# Patient Record
Sex: Male | Born: 2000 | Race: White | Hispanic: No | Marital: Single | Smoking: Never smoker
Health system: Southern US, Community
[De-identification: ages and names within clinical notes are randomized; demographics above are authoritative.]

## PROBLEM LIST (undated history)

## (undated) DIAGNOSIS — R197 Diarrhea, unspecified: Secondary | ICD-10-CM

## (undated) DIAGNOSIS — R569 Unspecified convulsions: Secondary | ICD-10-CM

## (undated) DIAGNOSIS — K219 Gastro-esophageal reflux disease without esophagitis: Secondary | ICD-10-CM

## (undated) DIAGNOSIS — T744XXA Shaken infant syndrome, initial encounter: Secondary | ICD-10-CM

## (undated) DIAGNOSIS — G809 Cerebral palsy, unspecified: Secondary | ICD-10-CM

## (undated) DIAGNOSIS — H905 Unspecified sensorineural hearing loss: Secondary | ICD-10-CM

## (undated) DIAGNOSIS — F419 Anxiety disorder, unspecified: Secondary | ICD-10-CM

## (undated) DIAGNOSIS — H547 Unspecified visual loss: Secondary | ICD-10-CM

## (undated) HISTORY — PX: GASTROSTOMY-JEJEUNOSTOMY TUBE CHANGE/PLACEMENT: SHX1705

## (undated) HISTORY — PX: LEG SURGERY: SHX1003

## (undated) HISTORY — DX: Shaken infant syndrome, initial encounter: T74.4XXA

## (undated) HISTORY — PX: TYMPANOSTOMY TUBE PLACEMENT: SHX32

## (undated) HISTORY — PX: VENTRICULOPERITONEAL SHUNT: SHX204

## (undated) HISTORY — DX: Diarrhea, unspecified: R19.7

## (undated) HISTORY — DX: Gastro-esophageal reflux disease without esophagitis: K21.9

## (undated) HISTORY — PX: CRANIOTOMY: SHX93

---
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
## 2022-01-18 ENCOUNTER — Emergency Department (HOSPITAL_COMMUNITY): Payer: Medicaid Other

## 2022-01-18 ENCOUNTER — Emergency Department (HOSPITAL_COMMUNITY)
Admission: EM | Admit: 2022-01-18 | Discharge: 2022-01-18 | Disposition: A | Discharge status: Home / Self Care | Payer: Medicaid Other | Attending: Emergency Medicine | Admitting: Emergency Medicine

## 2022-01-18 ENCOUNTER — Encounter (HOSPITAL_COMMUNITY): Payer: Self-pay

## 2022-01-18 ENCOUNTER — Other Ambulatory Visit: Payer: Self-pay

## 2022-01-18 DIAGNOSIS — R001 Bradycardia, unspecified: Secondary | ICD-10-CM | POA: Diagnosis not present

## 2022-01-18 DIAGNOSIS — R112 Nausea with vomiting, unspecified: Secondary | ICD-10-CM | POA: Insufficient documentation

## 2022-01-18 DIAGNOSIS — R109 Unspecified abdominal pain: Secondary | ICD-10-CM | POA: Insufficient documentation

## 2022-01-18 LAB — COMPREHENSIVE METABOLIC PANEL
ALT: 25 U/L (ref 0–44)
AST: 31 U/L (ref 15–41)
Albumin: 4.6 g/dL (ref 3.5–5.0)
Alkaline Phosphatase: 77 U/L (ref 38–126)
Anion gap: 14 (ref 5–15)
BUN: 10 mg/dL (ref 6–20)
CO2: 22 mmol/L (ref 22–32)
Calcium: 9.9 mg/dL (ref 8.9–10.3)
Chloride: 106 mmol/L (ref 98–111)
Creatinine, Ser: 0.63 mg/dL (ref 0.61–1.24)
GFR, Estimated: >60 mL/min (ref >60)
Glucose, Bld: 146 mg/dL — ABNORMAL HIGH (ref 70–99)
Potassium: 4.5 mmol/L (ref 3.5–5.1)
Sodium: 142 mmol/L (ref 135–145)
Total Bilirubin: 0.8 mg/dL (ref 0.3–1.2)
Total Protein: 7.4 g/dL (ref 6.5–8.1)

## 2022-01-18 LAB — URINALYSIS, ROUTINE W REFLEX MICROSCOPIC
Bilirubin Urine: NEGATIVE
Glucose, UA: NEGATIVE mg/dL
Hgb urine dipstick: NEGATIVE
Ketones, ur: NEGATIVE mg/dL
Leukocytes,Ua: NEGATIVE
Nitrite: NEGATIVE
Protein, ur: 30 mg/dL — AB
Specific Gravity, Urine: 1.015 (ref 1.005–1.030)
pH: 7 (ref 5.0–8.0)

## 2022-01-18 LAB — CBC WITH DIFFERENTIAL/PLATELET
Abs Immature Granulocytes: 0.07 10*3/uL (ref 0.00–0.07)
Basophils Absolute: 0 10*3/uL (ref 0.0–0.1)
Basophils Relative: 0 %
Eosinophils Absolute: 0 10*3/uL (ref 0.0–0.5)
Eosinophils Relative: 0 %
HCT: 54.5 % — ABNORMAL HIGH (ref 39.0–52.0)
Hemoglobin: 17.3 g/dL — ABNORMAL HIGH (ref 13.0–17.0)
Immature Granulocytes: 1 %
Lymphocytes Relative: 4 %
Lymphs Abs: 0.6 10*3/uL — ABNORMAL LOW (ref 0.7–4.0)
MCH: 31 pg (ref 26.0–34.0)
MCHC: 31.7 g/dL (ref 30.0–36.0)
MCV: 97.7 fL (ref 80.0–100.0)
Monocytes Absolute: 0.8 10*3/uL (ref 0.1–1.0)
Monocytes Relative: 5 %
Neutro Abs: 12.7 10*3/uL — ABNORMAL HIGH (ref 1.7–7.7)
Neutrophils Relative %: 90 %
Platelets: 220 10*3/uL (ref 150–400)
RBC: 5.58 MIL/uL (ref 4.22–5.81)
RDW: 12.7 % (ref 11.5–15.5)
WBC: 14.2 10*3/uL — ABNORMAL HIGH (ref 4.0–10.5)
nRBC: 0 % (ref 0.0–0.2)

## 2022-01-18 LAB — URINALYSIS, MICROSCOPIC (REFLEX)

## 2022-01-18 LAB — LACTIC ACID, PLASMA: Lactic Acid, Venous: 3.7 mmol/L (ref 0.5–1.9)

## 2022-01-18 LAB — LIPASE, BLOOD: Lipase: 46 U/L (ref 11–51)

## 2022-01-18 LAB — CORTISOL: Cortisol, Plasma: 41.5 ug/dL

## 2022-01-18 MED ORDER — PEDIALYTE PO SOLN
500.0000 mL | ORAL | Status: DC
  Filled 2022-01-18: qty 1000

## 2022-01-18 MED ORDER — ONDANSETRON 4 MG PO TBDP
4.0000 mg | ORAL_TABLET | Freq: Once | ORAL | Status: AC
  Administered 2022-01-18: 4 mg via ORAL
  Filled 2022-01-18: qty 1

## 2022-01-18 MED ORDER — ACETAMINOPHEN 160 MG/5ML PO SOLN
650.0000 mg | Freq: Once | ORAL | Status: AC
  Administered 2022-01-18: 650 mg
  Filled 2022-01-18: qty 20.3

## 2022-01-18 MED ORDER — CALCIUM CARBONATE ANTACID 500 MG PO CHEW
1.0000 | CHEWABLE_TABLET | Freq: Once | ORAL | Status: AC
  Administered 2022-01-18: 200 mg via JEJUNOSTOMY
  Filled 2022-01-18: qty 1

## 2022-01-18 NOTE — ED Notes (Signed)
EDP notified of elevated lactic 

## 2022-01-18 NOTE — ED Triage Notes (Signed)
Patient has CP and has a lot of stomach issues and [facility] children wanted him seen if he had another flare up of abd pain and vomiting This episode started last night.

## 2022-01-18 NOTE — Discharge Instructions (Signed)
Your son was seen in the emergency department for his nausea and vomiting.  This seems to be consistent with his previous episodes of cyclical vomiting thought to be from his gastroparesis.  He had no further vomiting while in the emergency department.  He did have some signs of dehydration on his labs and you should hydrate him with Pedialyte and give him Zofran as needed.  You should call the motility team to discuss his symptoms today and to ensure that he has appropriate follow-up.  He should return to the emergency department if he is not tolerating his feeds, he has fevers, he is worsening confusion or if you have any other new or concerning symptoms.

## 2022-01-18 NOTE — ED Notes (Signed)
DC instructions reviewed with pt's family. Understanding was verbalized. PT DC.

## 2022-01-18 NOTE — ED Provider Notes (Signed)
[facility] EMERGENCY DEPARTMENT Provider Note   CSN: [id number] Arrival date & time: 01/18/22  0725     History  Chief Complaint  Patient presents with   Abdominal Pain    Jonathan Booker is a 21 y.o. male.  Patient is an 21 year old male with a past medical history of cerebral palsy, seizure disorder, GERD and gastroparesis with J-tube in place presenting to the emergency department with nausea and vomiting.  Patient is here with his mother who states that she fed him dinner normally last night and then found him vomiting in his room overnight.  She states that he regurgitated all undigested food.  She states that he is also having abdominal pain.  She states that he has not had any fevers or diarrhea or constipation.  She states that he has been urinating normally.  She states that he is being seen by a GI specialist at [facility] and was recommended to come to the emergency department for evaluation for abdominal migraines versus gastroparesis as a cause of his vomiting.   Abdominal Pain      Home Medications Prior to Admission medications   Medication Sig Start Date End Date Taking? Authorizing Provider  acetaminophen (TYLENOL) 160 MG/5ML solution Take 320 mg by mouth every 6 (six) hours as needed for mild pain, fever or headache.    [provider]  Acetylcysteine 600 MG CAPS Take 2 capsules by mouth 2 (two) times daily.    [provider]  albuterol (PROVENTIL HFA;VENTOLIN HFA) 108 (90 BASE) MCG/ACT inhaler Inhale 1-2 puffs into the lungs every 6 (six) hours as needed for wheezing or shortness of breath.     [provider]  ALPRAZolam Prudy Feeler) 1 MG tablet Take 1 mg by mouth daily as needed for anxiety or sleep.    [provider]  budesonide (PULMICORT) 0.5 MG/2ML nebulizer solution 1 mL at bedtime. 60mL in each nostril    [provider]  budesonide-formoterol (SYMBICORT) 160-4.5 MCG/ACT  inhaler Inhale 2 puffs into the lungs 2 (two) times daily as needed (COPD).     [provider]  calcium carbonate (TUMS EX) 750 MG chewable tablet Chew 1 tablet by mouth 2 (two) times daily as needed for heartburn.     [provider]  cholecalciferol (VITAMIN D) 25 MCG (1000 UT) tablet Take 1,000 Units by mouth at bedtime.    [provider]  clindamycin-benzoyl peroxide (BENZACLIN) gel Apply 1 application topically at bedtime. Face and back    [provider]  cloNIDine HCl (KAPVAY) 0.1 MG TB12 ER tablet Take 0.1-0.2 mg by mouth See admin instructions. Take 2 tablets in the morning and 1 tablet at 1pm.    [provider]  diphenhydrAMINE (BENADRYL) 2 % cream Apply 1 application topically 2 (two) times daily as needed for itching.     [provider]  Ferrous Sulfate (IRON PO) Take 1 tablet by mouth every other day.    [provider]  fluticasone (FLONASE) 50 MCG/ACT nasal spray Place 1-2 sprays into both nostrils daily.     [provider]  fluticasone (FLOVENT HFA) 110 MCG/ACT inhaler Inhale 2 puffs into the lungs at bedtime.     [provider]  gabapentin (NEURONTIN) 300 MG capsule Take 300 mg by mouth 3 (three) times daily.  06/18/16   [provider]  Glycerin, Laxative, (GLYCERIN, ADULT, RE) Place 7.5 mLs rectally daily.    [provider]  ipratropium (  ATROVENT) 0.06 % nasal spray Place 1-2 sprays into the nose 4 (four) times daily as needed (runny nose).     [provider]  LamoTRIgine (LAMICTAL XR) 300 MG TB24 24 hour tablet Take 300 mg by mouth at bedtime.    [provider]  levalbuterol Penne Lash) 1.25 MG/3ML nebulizer solution Take 1.25 mg by nebulization 4 (four) times daily as needed for wheezing or shortness of breath.     [provider]  loratadine (CLARITIN) 10 MG tablet Take 10 mg by mouth every evening.     [provider]  lurasidone (LATUDA)  20 MG TABS tablet Take 10-20 mg by mouth See admin instructions. Take 1/2 tablet in the morning and 1 tablet at bedtime    [provider]  magnesium oxide (MAG-OX) 400 MG tablet Take 400 mg by mouth every other day.    [provider]  Melatonin 5 MG CHEW Chew 5 mg by mouth at bedtime.     [provider]  midazolam (VERSED) 5 MG/ML injection Place 5 mg into the nose as needed (For seizures lasting longer than 5 minutes). Draw up prescribed dose (ml) in syringe, remove blue vial access device, then attach syringe to nasal atomizer for intranasal administration.    [provider]  Multiple Vitamin (MULTIVITAMIN WITH MINERALS) TABS tablet Take 2 tablets by mouth daily.    [provider]  mupirocin ointment (BACTROBAN) 2 % Place 1 application into the nose 3 (three) times daily as needed (Rash or skin breakdown).    [provider]  nystatin (MYCOSTATIN/NYSTOP) powder Apply 1 g topically 3 (three) times daily as needed (yeast infection).    [provider]  omeprazole (PRILOSEC) 20 MG capsule Take 20 mg by mouth daily.    [provider]  polyethylene glycol powder (GLYCOLAX/MIRALAX) powder Take 17 g by mouth daily.     [provider]  PRESCRIPTION MEDICATION Apply 1 application topically 2 (two) times daily as needed (Diaper Rash). Zinc ointment and Nystatin cream    [provider]  saccharomyces boulardii (FLORASTOR) 250 MG capsule Take 250 mg by mouth daily as needed (loose stool when on antibiotics).    [provider]  sertraline (ZOLOFT) 100 MG tablet Take 100 mg by mouth at bedtime.    [provider]  trimethoprim-polymyxin b (POLYTRIM) ophthalmic solution Place 1 drop into both eyes every 4 (four) hours. Patient not taking: Reported on 05/20/2018 08/15/17   Glyn Ade, PA-C      Allergies    Eryped [erythromycin], Reglan [metoclopramide], and Tape    Review of Systems    Review of Systems  Gastrointestinal:  Positive for abdominal pain.    Physical Exam Updated Vital Signs BP 126/60   Pulse (!) 56   Temp 97.7 F (36.5 C) (Oral)   Resp 17   SpO2 98%  Physical Exam Vitals and nursing note reviewed.  Constitutional:      General: He is not in acute distress. HENT:     Head: Normocephalic and atraumatic.     Mouth/Throat:     Mouth: Mucous membranes are moist.     Pharynx: Oropharynx is clear.  Eyes:     Extraocular Movements: Extraocular movements intact.     Pupils: Pupils are equal, round, and reactive to light.  Cardiovascular:     Rate and Rhythm: Regular rhythm. Bradycardia present.     Heart sounds: Normal heart sounds.  Pulmonary:     Effort: Pulmonary  effort is normal.     Breath sounds: Normal breath sounds.  Abdominal:     General: Abdomen is flat.     Palpations: Abdomen is soft.     Tenderness: There is generalized abdominal tenderness. There is no guarding or rebound.     Comments: J-tube in place, no surrounding erythema, warmth or drainage  Skin:    General: Skin is warm and dry.  Neurological:     Mental Status: He is alert.     Comments: Awake and alert, nods yes and no to some questions, contracted in all 4 extremities, at baseline neurologic status per mom     ED Results / Procedures / Treatments   Labs (all labs ordered are listed, but only abnormal results are displayed) Labs Reviewed  COMPREHENSIVE METABOLIC PANEL - Abnormal; Notable for the following components:      Result Value   Glucose, Bld 146 (*)    All other components within normal limits  CBC WITH DIFFERENTIAL/PLATELET - Abnormal; Notable for the following components:   WBC 14.2 (*)    Hemoglobin 17.3 (*)    HCT 54.5 (*)    Neutro Abs 12.7 (*)    Lymphs Abs 0.6 (*)    All other components within normal limits  LACTIC ACID, PLASMA - Abnormal; Notable for the following components:   Lactic Acid, Venous 3.7 (*)    All other components within  normal limits  LIPASE, BLOOD  CORTISOL  LACTIC ACID, PLASMA  ACTH  PORPHOBILINOGEN, RANDOM URINE  URINALYSIS, ROUTINE W REFLEX MICROSCOPIC  AMMONIA  CARNITINE    EKG EKG Interpretation  Date/Time:  Monday January 18 2022 10:52:49 EDT Ventricular Rate:  47 PR Interval:  137 QRS Duration: 100 QT Interval:  432 QTC Calculation: 382 R Axis:   110 Text Interpretation: Slow sinus arrhythmia Borderline right axis deviation ST elevation likely early repolariztion No previous ECGs available Confirmed by Arturo MortonKneller, Canden Cieslinski ([id number]) on 01/18/2022 12:20:45 PM  Radiology US Renal  Result Date: 01/18/2022 CLINICAL DATA:  Vomiting, evaluate for hydronephrosis EXAM: RENAL / URINARY TRACT ULTRASOUND COMPLETE COMPARISON:  None Available. FINDINGS: Right Kidney: Renal measurements: 10.4 x 4.6 x 5.1 cm = volume: 126.4 mL. Echogenicity within normal limits. No mass or hydronephrosis visualized. Left Kidney: Renal measurements: 10.5 x 4.6 x 4.6 cm = volume: 114.5 mL. Echogenicity within normal limits. No mass or hydronephrosis visualized. Bladder: Appears normal for degree of bladder distention. Other: None. IMPRESSION: 1. Normal renal ultrasound. Electronically Signed   By: Elige KoHetal  Patel M.D.   On: 01/18/2022 09:01    Procedures Procedures    Medications Ordered in ED Medications  Pedialyte solution SOLN 500 mL (has no administration in time range)  calcium carbonate (TUMS - dosed in mg elemental calcium) chewable tablet 200 mg of elemental calcium (200 mg of elemental calcium Per J Tube Given 01/18/22 1107)  acetaminophen (TYLENOL) 160 MG/5ML solution 650 mg (650 mg Per Tube Given 01/18/22 1107)  ondansetron (ZOFRAN-ODT) disintegrating tablet 4 mg (4 mg Oral Given 01/18/22 1107)    ED Course/ Medical Decision Making/ A&P Clinical Course as of 01/18/22 1417  Mon Jan 18, 2022  1215 Patient was ordered Pedialyte through his J-tube with is pending.  His lactate is elevated to 3.7 with an elevated white  count of 14 which may be related to infection versus severe dehydration in the setting of his vomiting.  I spoke to the patient's mother about trying to place an IV so that we can reorder his  ammonia and carnitine as well as give him IV fluids and potentially obtain a CT scan.  The mother is very hesitant about IV placement as she states he is a hard stick and the IV team has already been by to see him without any success.  She states that she would like to leave if we are unable to treat him without an IV.  I offered to speak to the patient's GI doctor at [facility] for further recommendations, Dr. Burnadette Pop and mother is agreeable with this plan. [VK]  1250 I spoke with the on call GI doctor at [place] who states the patient is undergoing multiple motility studies at this time and current symptoms consistent with prior episodes of cyclical vomiting and abdominal pain. He recommended zofran and pedialyte challenge and if the patient can tolerate pedialyte via the J tube he is ok with holding off on the IV for IV fluids or repeat labs. He recommends following up with the motility team if the patient will be discharged. [VK]  1413 Patient has had no further vomiting in the emergency department.  Pharmacy is unable to supply the Pedialyte with the pump and we offered to mom giving smaller portions of Pedialyte through his J-tube but she states that she would prefer to give him Pedialyte at home.  Patient's urine is pending but the patient's mother stated she would not like to wait for his urine to come back and we will follow-up his results on MyChart.  She states that she will call his doctor this week and has a appointment already scheduled on Monday.  She states that she will return if he is not tolerating his feeds at home. [VK]    Clinical Course User Index [VK] Phoebe Sharps, DO                           Medical Decision Making This patient presents to the ED with chief complaint(s) of nausea/vomiting  with pertinent past medical history of GERD, gastroparesis, cerebral palsy, seizure disorder which further complicates the presenting complaint. The complaint involves an extensive differential diagnosis and also carries with it a high risk of complications and morbidity.    The differential diagnosis includes dehydration, electrolyte abnormality, gastroparesis, GERD, abdominal migraine, SBO less likely as patient is having normal bowel movements and abdomen does not feel distended, possible intra-abdominal infection he has no point tenderness  Additional history obtained: Additional history obtained from family Records reviewed Primary Care Documents and primary GI doctor records  ED Course and Reassessment: Upon patient's arrival to the emergency department he is awake and alert well-appearing though in pain per his mom.  She has records from his GI doctor that he last saw on 9/8 who recommended that he come to the emergency department for work-up of abdominal migraines for his repetitive episodes of nausea and vomiting.  They recommended labs including lactate, ammonia, carnitine, ACTH, ADH, cortisol, CoQ 10, urine for organic acids, D alae and porphobilinogen as well as a renal ultrasound to evaluate for hydronephrosis.  I have ordered the labs that are available outside of ADH, CoQ 10 and urine d-ALA in addition to labs for work-up for other causes of the patient's pain.  I will plan to speak with the patient's GI doctor once his work-up has resulted for further recommendations and management.  Independent labs interpretation:  The following labs were independently interpreted: Elevated lactate, elevated WBC  Independent visualization of imaging: -  I independently visualized the following imaging with scope of interpretation limited to determining acute life threatening conditions related to emergency care: Renal ultrasound, which revealed no acute disease  Consultation: - Consulted or  discussed management/test interpretation w/ external professional: Pediatric GI at [facility]  Consideration for admission or further workup: Patient was offered further work-up and IV hydration for cause of his nausea and vomiting and abdominal pain, however the patient's mother states that she would prefer hydration at home via his J-tube and to follow-up with his GI doctors.  They were given strict return precautions. Social Determinants of health: N/A    Amount and/or Complexity of Data Reviewed Labs: ordered. Radiology: ordered.  Risk OTC drugs. Prescription drug management.          Final Clinical Impression(s) / ED Diagnoses Final diagnoses:  Nausea and vomiting, unspecified vomiting type    Rx / DC Orders ED Discharge Orders     None         Phoebe Sharps, DO 01/18/22 1417

## 2022-01-20 LAB — PORPHOBILINOGEN, RANDOM URINE: Quantitative Porphobilinogen: 0.8 mg/L (ref 0.0–2.0)

## 2022-01-28 IMAGING — DX DG ABDOMEN 1V
1 series · 1 of 1 positions shown · non-contrast
Comparison: None.

CLINICAL DATA: J-tube injection

EXAM:
ABDOMEN - 1 VIEW

[abdomen kub]
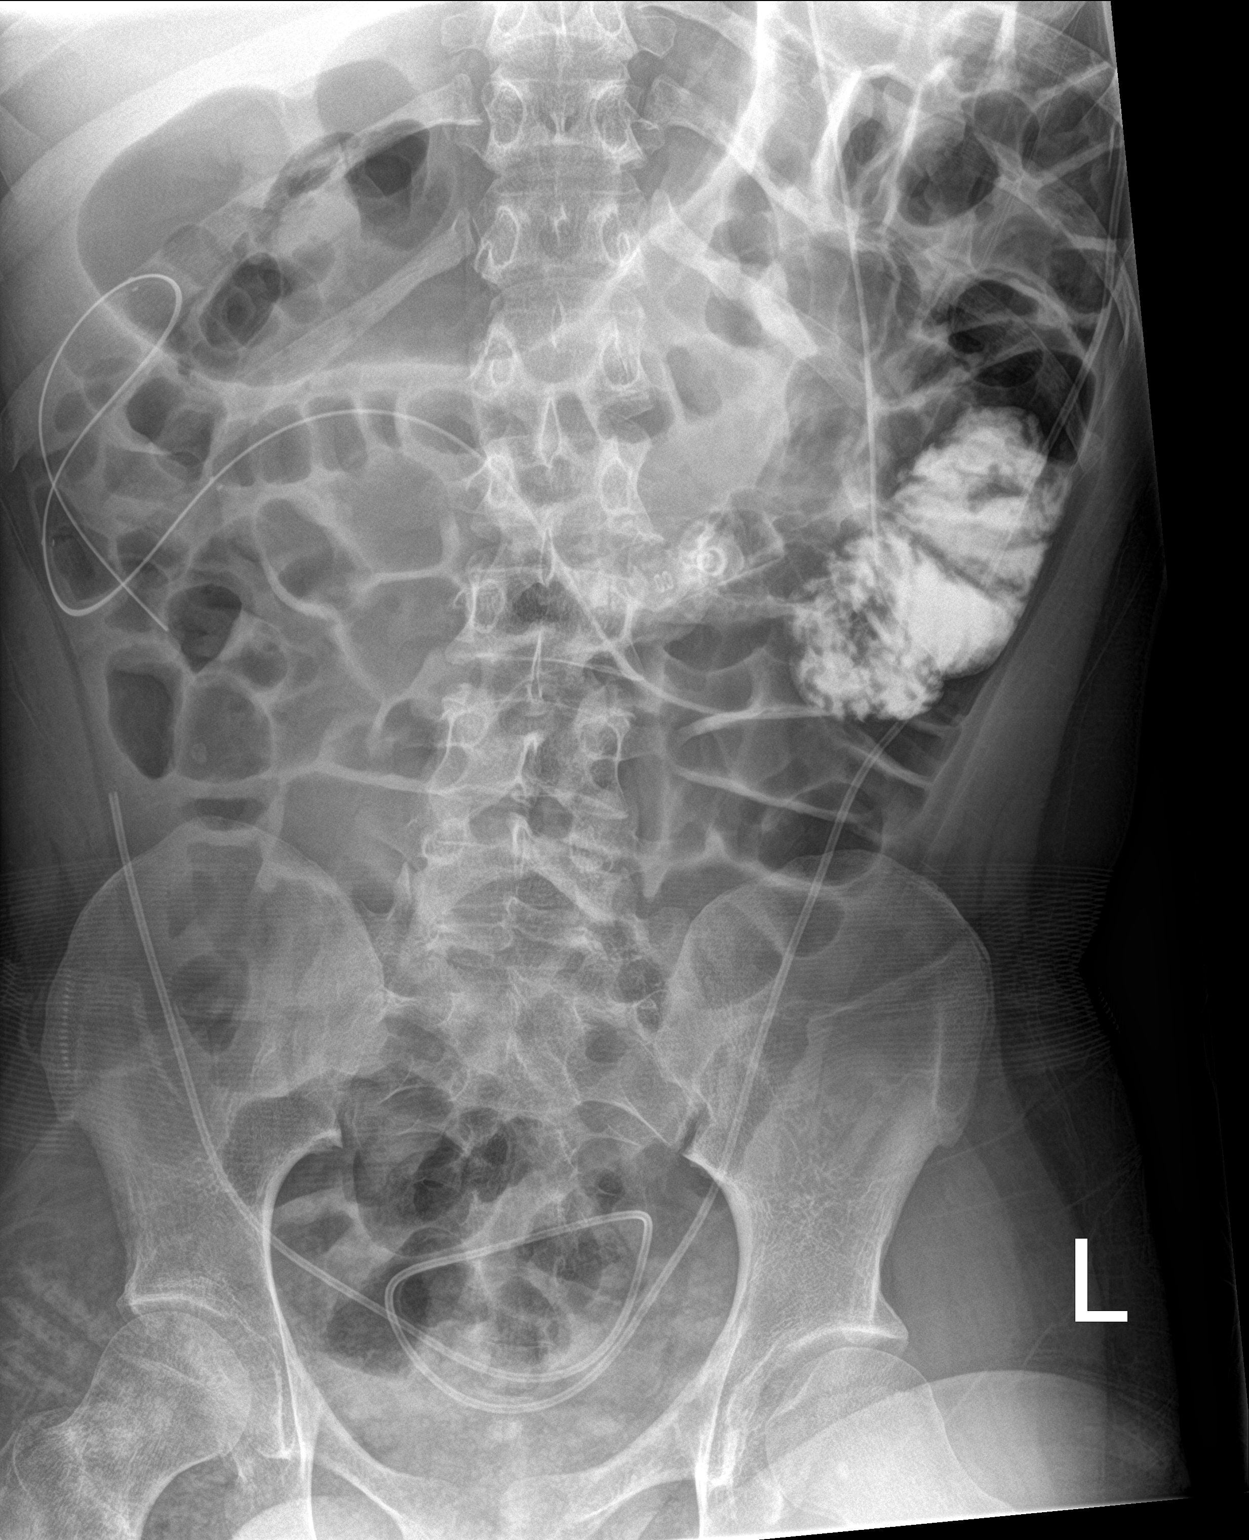

[1 of 1 positions shown; findings below may reference images not displayed]

FINDINGS: Jejunostomy tube with contrast injected through the tube opacifying
the proximal jejunum. No extraluminal contrast. Mild gaseous
distension of the small bowel and colon. There is no bowel
dilatation to suggest obstruction. There is no evidence of
pneumoperitoneum, portal venous gas or pneumatosis.

There are no pathologic calcifications along the expected course of
the ureters. There is a ventriculoperitoneal shunt catheter tubing
noted in the lower abdomen pelvis.

The osseous structures are unremarkable.
IMPRESSION: 1. Jejunostomy tube with contrast injected through the tube
opacifying the proximal jejunum. No extraluminal contrast.

## 2022-04-06 ENCOUNTER — Emergency Department (HOSPITAL_COMMUNITY): Payer: Medicaid Other

## 2022-04-06 ENCOUNTER — Emergency Department (HOSPITAL_COMMUNITY)
Admission: EM | Admit: 2022-04-06 | Discharge: 2022-04-07 | Disposition: A | Discharge status: Other Acute Inpatient Hospital | Payer: Medicaid Other | Attending: Emergency Medicine | Admitting: Emergency Medicine

## 2022-04-06 ENCOUNTER — Other Ambulatory Visit: Payer: Self-pay

## 2022-04-06 ENCOUNTER — Encounter (HOSPITAL_COMMUNITY): Payer: Self-pay

## 2022-04-06 DIAGNOSIS — K9403 Colostomy malfunction: Secondary | ICD-10-CM | POA: Diagnosis not present

## 2022-04-06 DIAGNOSIS — K659 Peritonitis, unspecified: Secondary | ICD-10-CM | POA: Insufficient documentation

## 2022-04-06 DIAGNOSIS — R109 Unspecified abdominal pain: Secondary | ICD-10-CM | POA: Diagnosis present

## 2022-04-06 DIAGNOSIS — R17 Unspecified jaundice: Secondary | ICD-10-CM

## 2022-04-06 LAB — COMPREHENSIVE METABOLIC PANEL
ALT: 22 U/L (ref 0–44)
AST: 13 U/L — ABNORMAL LOW (ref 15–41)
Albumin: 3.1 g/dL — ABNORMAL LOW (ref 3.5–5.0)
Alkaline Phosphatase: 80 U/L (ref 38–126)
Anion gap: 8 (ref 5–15)
BUN: 14 mg/dL (ref 6–20)
CO2: 29 mmol/L (ref 22–32)
Calcium: 9.7 mg/dL (ref 8.9–10.3)
Chloride: 103 mmol/L (ref 98–111)
Creatinine, Ser: 0.58 mg/dL — ABNORMAL LOW (ref 0.61–1.24)
GFR, Estimated: >60 mL/min (ref >60)
Glucose, Bld: 80 mg/dL (ref 70–99)
Potassium: 3.5 mmol/L (ref 3.5–5.1)
Sodium: 140 mmol/L (ref 135–145)
Total Bilirubin: 1.4 mg/dL — ABNORMAL HIGH (ref 0.3–1.2)
Total Protein: 5.4 g/dL — ABNORMAL LOW (ref 6.5–8.1)

## 2022-04-06 LAB — CBC WITH DIFFERENTIAL/PLATELET
Abs Immature Granulocytes: 0.12 10*3/uL — ABNORMAL HIGH (ref 0.00–0.07)
Basophils Absolute: 0 10*3/uL (ref 0.0–0.1)
Basophils Relative: 0 %
Eosinophils Absolute: 0 10*3/uL (ref 0.0–0.5)
Eosinophils Relative: 0 %
HCT: 43 % (ref 39.0–52.0)
Hemoglobin: 14.8 g/dL (ref 13.0–17.0)
Immature Granulocytes: 1 %
Lymphocytes Relative: 5 %
Lymphs Abs: 1.1 10*3/uL (ref 0.7–4.0)
MCH: 31 pg (ref 26.0–34.0)
MCHC: 34.4 g/dL (ref 30.0–36.0)
MCV: 90 fL (ref 80.0–100.0)
Monocytes Absolute: 1.6 10*3/uL — ABNORMAL HIGH (ref 0.1–1.0)
Monocytes Relative: 8 %
Neutro Abs: 18.1 10*3/uL — ABNORMAL HIGH (ref 1.7–7.7)
Neutrophils Relative %: 86 %
Platelets: 297 10*3/uL (ref 150–400)
RBC: 4.78 MIL/uL (ref 4.22–5.81)
RDW: 12.6 % (ref 11.5–15.5)
WBC: 21 10*3/uL — ABNORMAL HIGH (ref 4.0–10.5)
nRBC: 0 % (ref 0.0–0.2)

## 2022-04-06 LAB — LACTIC ACID, PLASMA: Lactic Acid, Venous: 0.7 mmol/L (ref 0.5–1.9)

## 2022-04-06 LAB — LIPASE, BLOOD: Lipase: 30 U/L (ref 11–51)

## 2022-04-06 MED ORDER — DIATRIZOATE MEGLUMINE & SODIUM 66-10 % PO SOLN
ORAL | Status: AC
  Filled 2022-04-06: qty 30

## 2022-04-06 MED ORDER — LACTATED RINGERS IV BOLUS
1000.0000 mL | Freq: Once | INTRAVENOUS | Status: AC
  Administered 2022-04-06: 1000 mL via INTRAVENOUS

## 2022-04-06 MED ORDER — DIATRIZOATE MEGLUMINE & SODIUM 66-10 % PO SOLN
30.0000 mL | Freq: Once | ORAL | Status: DC
  Filled 2022-04-06: qty 30

## 2022-04-06 NOTE — ED Triage Notes (Signed)
Pt arrived by EMS form home  Hx of TBI   Nov 16th had new G tube and button placed, has not been doing well after surgery, in [place]   After flushing tube last night tube seems to have dislodged. Talked with Providers about this but since then pt has started to have n/v and abdominal pain    Bleeding and site   100.9 fever, mother gave tylenol

## 2022-04-06 NOTE — ED Provider Triage Note (Signed)
Emergency Medicine Provider Triage Evaluation Note  Jonathan Booker , a 21 y.o. male  was evaluated in triage.  Pt complains of abdominal pain, distension, fever and vomiting since last night. Hx gastroparesis/constipation status post J-tube and subsequent PEG and cecostomy tubes on 11-17, who presents to ED for evaluation of cecostomy tube dysfunction.  Seen at outside ED in [place] 4 days ago after cecostomy was not draining to gravity and has not been using since.  Increased pain, abdominal distension today with fever to 100.9 and vomiting.  Review of Systems  Positive: Fever, abdominal pain, vomiting Negative: Chest pain, shortness of breath  Physical Exam  There were no vitals taken for this visit. Gen:   Awake, no distress   Resp:  Normal effort  MSK:   Moves extremities without difficulty  Other:  G-tube and J-tube in place.  Cecostomy in right lower quadrant appears loose.  Diffuse tenderness with voluntary guarding.  No rebound.  Medical Decision Making  Medically screening exam initiated at 9:08 PM.  Appropriate orders placed.  Jonathan Booker was informed that the remainder of the evaluation will be completed by another provider, this initial triage assessment does not replace that evaluation, and the importance of remaining in the ED until their evaluation is complete.     Glynn Octave, MD 04/06/22 2114

## 2022-04-07 ENCOUNTER — Emergency Department: Payer: Self-pay

## 2022-04-07 DIAGNOSIS — K9403 Colostomy malfunction: Secondary | ICD-10-CM | POA: Diagnosis not present

## 2022-04-07 DIAGNOSIS — R109 Unspecified abdominal pain: Secondary | ICD-10-CM | POA: Diagnosis present

## 2022-04-07 DIAGNOSIS — K659 Peritonitis, unspecified: Secondary | ICD-10-CM | POA: Diagnosis not present

## 2022-04-07 LAB — CBC
HCT: 34.3 % — ABNORMAL LOW (ref 39.0–52.0)
Hemoglobin: 11.9 g/dL — ABNORMAL LOW (ref 13.0–17.0)
MCH: 31.5 pg (ref 26.0–34.0)
MCHC: 34.7 g/dL (ref 30.0–36.0)
MCV: 90.7 fL (ref 80.0–100.0)
Platelets: 249 10*3/uL (ref 150–400)
RBC: 3.78 MIL/uL — ABNORMAL LOW (ref 4.22–5.81)
RDW: 12.7 % (ref 11.5–15.5)
WBC: 20.3 10*3/uL — ABNORMAL HIGH (ref 4.0–10.5)
nRBC: 0 % (ref 0.0–0.2)

## 2022-04-07 LAB — LACTIC ACID, PLASMA: Lactic Acid, Venous: 0.9 mmol/L (ref 0.5–1.9)

## 2022-04-07 MED ORDER — MORPHINE SULFATE (PF) 4 MG/ML IV SOLN
4.0000 mg | Freq: Once | INTRAVENOUS | Status: AC
  Administered 2022-04-07: 4 mg via INTRAVENOUS
  Filled 2022-04-07: qty 1

## 2022-04-07 MED ORDER — ACETAMINOPHEN 650 MG RE SUPP
650.0000 mg | Freq: Once | RECTAL | Status: AC
  Administered 2022-04-07: 650 mg via RECTAL
  Filled 2022-04-07: qty 1

## 2022-04-07 MED ORDER — IOHEXOL 350 MG/ML SOLN
60.0000 mL | Freq: Once | INTRAVENOUS | Status: AC | PRN
  Administered 2022-04-07: 60 mL via INTRAVENOUS

## 2022-04-07 MED ORDER — NOREPINEPHRINE 4 MG/250ML-% IV SOLN
2.0000 ug/min | INTRAVENOUS | Status: DC
  Filled 2022-04-07: qty 250

## 2022-04-07 MED ORDER — SODIUM CHLORIDE 0.9 % IV BOLUS
1000.0000 mL | Freq: Once | INTRAVENOUS | Status: AC
  Administered 2022-04-07: 1000 mL via INTRAVENOUS

## 2022-04-07 MED ORDER — VANCOMYCIN HCL IN DEXTROSE 1-5 GM/200ML-% IV SOLN: 1000.0000 mg | Freq: Two times a day (BID) | INTRAVENOUS | Status: DC

## 2022-04-07 MED ORDER — METRONIDAZOLE 500 MG/100ML IV SOLN
500.0000 mg | Freq: Once | INTRAVENOUS | Status: AC
  Administered 2022-04-07: 500 mg via INTRAVENOUS
  Filled 2022-04-07: qty 100

## 2022-04-07 MED ORDER — ONDANSETRON HCL 4 MG/2ML IJ SOLN
4.0000 mg | Freq: Once | INTRAMUSCULAR | Status: AC
  Administered 2022-04-07: 4 mg via INTRAVENOUS
  Filled 2022-04-07: qty 2

## 2022-04-07 MED ORDER — FENTANYL CITRATE PF 50 MCG/ML IJ SOSY
50.0000 ug | PREFILLED_SYRINGE | Freq: Once | INTRAMUSCULAR | Status: AC
  Administered 2022-04-07: 50 ug via INTRAVENOUS
  Filled 2022-04-07: qty 1

## 2022-04-07 MED ORDER — VANCOMYCIN HCL IN DEXTROSE 1-5 GM/200ML-% IV SOLN
1000.0000 mg | Freq: Once | INTRAVENOUS | Status: AC
  Administered 2022-04-07: 1000 mg via INTRAVENOUS
  Filled 2022-04-07: qty 200

## 2022-04-07 MED ORDER — LACTATED RINGERS IV BOLUS: 100.0000 mL | Freq: Once | INTRAVENOUS | Status: DC

## 2022-04-07 MED ORDER — SODIUM CHLORIDE 0.9 % IV SOLN: 250.0000 mL | INTRAVENOUS | Status: DC

## 2022-04-07 MED ORDER — LACTATED RINGERS IV SOLN: INTRAVENOUS | Status: DC

## 2022-04-07 MED ORDER — SODIUM CHLORIDE 0.9 % IV BOLUS: 500.0000 mL | Freq: Once | INTRAVENOUS | Status: DC

## 2022-04-07 MED ORDER — SODIUM CHLORIDE 0.9 % IV SOLN
2.0000 g | Freq: Once | INTRAVENOUS | Status: AC
  Administered 2022-04-07: 2 g via INTRAVENOUS
  Filled 2022-04-07: qty 20

## 2022-04-07 NOTE — Progress Notes (Signed)
Peripherally Inserted Central Catheter  At bedside for PICC placement. Attempted x 2 in the L brachial and L cephalic vessels.Unable to thread the wire beyond the clavicular region despite all interventions. Right arm restriction.Site cleaned and dressed. Primary RN aware.

## 2022-04-07 NOTE — ED Notes (Signed)
Per [facility] ED RN- they would like to be contacted when transport arrives to pick pt up [phone]).

## 2022-04-07 NOTE — ED Notes (Signed)
Pt mother requesting a temp d/t pt feeling warm - axillary temp charted. MD Preston Fleeting and primary RN Jill Side made aware

## 2022-04-07 NOTE — ED Provider Notes (Addendum)
Patient was received at signout by the overnight provider pending transport to [facility] in [place] where he received specialist care.  There was concern for possible peritonitis as he presented with abdominal pain, febrile and tachycardic now.  He had a leukocytosis of 21,000.  The patient was given Rocephin and Flagyl as well as a fluid bolus overnight and started on an LR bolus.  His blood pressure has increasingly lowered this morning, around 945 was 88/60 mmh.  He was also tachycardic with a heart rate in the 120s.  I have ordered nursing to draw a repeat lactate level and white blood cell count to trend infection, I have ordered an additional 500 cc fluid bolus, as well as peripheral vasopressors if MAP drops beneath 65 mmhg. Vancomycin added for broadened coverage for MRSA.    On exam the patient is awake, he continues to have some diffuse tenderness of the abdomen, and his mother present at bedside reports that he is in pain.  I ordered a 50 mcg bolus of fentanyl.  940 am -I have spoken again to [facility], reports that they will have an ALS truck and team available between 11am-12 pm at the earliest.  I also reach back to [facility] and their adult transport counterparts - neither has transportation available sooner than 11am-12 pm.  Clinical Course as of 04/07/22 1210  Wed Apr 07, 2022  1018 Blood pressure is improving, IV nurse at bedside. I've requested a PICC line due to his poor venous access (he is contractured, mother reports he "blows his veins" all the time), and given possible needs for vasopressors. Repeat WBC stable/improved, not worsening.   [MT]  1027 Repeat lactate wnl. [MT]  1209 [facility] is here.  Patient is completing PICC line placement.  Blood pressure vital signs of improved overall, blood pressure is improved.  I believe he is reasonably stable for transport at this time. [MT]    Clinical Course User Index [MT] Terald Sleeper, MD    Update -  At the time of transfer I was notified that the PICC team was not able to successfully place a picc line.  However, given that the patient's blood pressure is stabilized, and he had 2 good peripheral IVs for access, and not requiring Levophed, I did not feel that there was a need to further delay his transport for definitive care.  Patient reassessed and will be taken by [facility] now.  .Critical Care  Performed by: Terald Sleeper, MD Authorized by: Terald Sleeper, MD   Critical care provider statement:    Critical care time (minutes):  45   Critical care time was exclusive of:  Separately billable procedures and treating other patients   Critical care was necessary to treat or prevent imminent or life-threatening deterioration of the following conditions:  Sepsis and shock   Critical care was time spent personally by me on the following activities:  Ordering and performing treatments and interventions, ordering and review of laboratory studies, ordering and review of radiographic studies, pulse oximetry, review of old charts, examination of patient and evaluation of patient's response to treatment     Terald Sleeper, MD 04/07/22 1210    Terald Sleeper, MD 04/07/22 1256

## 2022-04-07 NOTE — Progress Notes (Signed)
Pharmacy Antibiotic Note  Jonathan Booker is a 21 y.o. male admitted on 04/06/2022 with sepsis.  Pharmacy has been consulted for vancomycin dosing. Pt is febrile with Tmax 100.7 and WBC is elevated at 20.3. Scr and lactic acid are WNL.   Plan: Vancomycin 1g IV Q12H  F/u renal fxn, C&S, clinical status and peak/trough at [facility] F/u continuation of CTX/flagyl if not transferred  Height: 5\' 2"  (157.5 cm) Weight: 55 kg (121 lb 4.1 oz) IBW/kg (Calculated) : 54.6  Temp (24hrs), Avg:99.7 F (37.6 C), Min:98.5 F (36.9 C), Max:100.7 F (38.2 C)  Recent Labs  Lab 04/06/22 2223 04/07/22 0941  WBC 21.0* 20.3*  CREATININE 0.58*  --   LATICACIDVEN 0.7  --     Estimated Creatinine Clearance: 115.6 mL/min (A) (by C-G formula based on SCr of 0.58 mg/dL (L)).    Allergies  Allergen Reactions   Eryped [Erythromycin]    Reglan [Metoclopramide]    Tape Hives, Rash and Itching    Other reaction(s): Other (See Comments) Sensitive to tape; peels skin off  Sensitive to tape; peels skin off Sensitive to tape; peels skin off Sensitive to tape; peels skin off    Antimicrobials this admission: Vanc 12/6>> CTX x 1 12/6 Flagyl x 2 doses 12/6  Dose adjustments this admission: N/A  Microbiology results: Pending  Thank you for allowing pharmacy to be a part of this patient's care.  Lavelle Akel, 14/6 04/07/2022 10:21 AM

## 2022-04-07 NOTE — ED Notes (Addendum)
Pt is on transport list for [facility] and [facility]). Both transport teams will not be available until day shift.

## 2022-04-07 NOTE — ED Notes (Signed)
Pt BP low. Dr. Renaye Rakers aware.

## 2022-04-07 NOTE — ED Provider Notes (Signed)
[facility] EMERGENCY DEPARTMENT Provider Note   CSN: [id number] Arrival date & time: 04/06/22  2055     History  Chief Complaint  Patient presents with   Abdominal Pain    Jonathan Booker is a 21 y.o. male.  The history is provided by a parent. The history is limited by the condition of the patient (Patient nonverbal).  Abdominal Pain He has history of cerebral palsy, seizures, cortical deafness, blindness, GERD and recently had gastrostomy and cecostomy performed at [facility] in [place].  He had the tubes evaluated on 12/1, but 2 days ago mother noted that the cecostomy tube seemed to have pulled out, and when she irrigated it it seemed to go into the abdominal wall.  She had been in touch with the providers and has an appointment with gastroenterology for 12/7.  Tonight, he developed fever to 100.9 and started vomiting and seem to be in pain.   Home Medications Prior to Admission medications   Medication Sig Start Date End Date Taking? Authorizing Provider  acetaminophen (TYLENOL) 160 MG/5ML solution Take 320 mg by mouth every 6 (six) hours as needed for mild pain, fever or headache.    [provider]  Acetylcysteine 600 MG CAPS Take 2 capsules by mouth 2 (two) times daily.    [provider]  albuterol (PROVENTIL HFA;VENTOLIN HFA) 108 (90 BASE) MCG/ACT inhaler Inhale 1-2 puffs into the lungs every 6 (six) hours as needed for wheezing or shortness of breath.     [provider]  ALPRAZolam Prudy Feeler) 1 MG tablet Take 1 mg by mouth daily as needed for anxiety or sleep.    [provider]  budesonide (PULMICORT) 0.5 MG/2ML nebulizer solution 1 mL at bedtime. 93mL in each nostril    [provider]  budesonide-formoterol (SYMBICORT) 160-4.5 MCG/ACT inhaler Inhale 2 puffs into the lungs 2 (two) times daily as needed (COPD).     [provider]  calcium carbonate (TUMS EX) 750 MG chewable tablet Chew 1  tablet by mouth 2 (two) times daily as needed for heartburn.     [provider]  cholecalciferol (VITAMIN D) 25 MCG (1000 UT) tablet Take 1,000 Units by mouth at bedtime.    [provider]  clindamycin-benzoyl peroxide (BENZACLIN) gel Apply 1 application topically at bedtime. Face and back    [provider]  cloNIDine HCl (KAPVAY) 0.1 MG TB12 ER tablet Take 0.1-0.2 mg by mouth See admin instructions. Take 2 tablets in the morning and 1 tablet at 1pm.    [provider]  diphenhydrAMINE (BENADRYL) 2 % cream Apply 1 application topically 2 (two) times daily as needed for itching.     [provider]  Ferrous Sulfate (IRON PO) Take 1 tablet by mouth every other day.    [provider]  fluticasone (FLONASE) 50 MCG/ACT nasal spray Place 1-2 sprays into both nostrils daily.     [provider]  fluticasone (FLOVENT HFA) 110 MCG/ACT inhaler Inhale 2 puffs into the lungs at bedtime.     [provider]  gabapentin (NEURONTIN) 300 MG capsule Take 300 mg by mouth 3 (three) times daily.  06/18/16   [provider]  Glycerin, Laxative, (GLYCERIN, ADULT, RE) Place 7.5 mLs rectally daily.    [provider]  ipratropium (ATROVENT) 0.06 % nasal spray Place 1-2 sprays into the nose 4 (four) times daily as needed (runny nose).     [provider]  LamoTRIgine (LAMICTAL XR)  300 MG TB24 24 hour tablet Take 300 mg by mouth at bedtime.    [provider]  levalbuterol Pauline Aus(XOPENEX) 1.25 MG/3ML nebulizer solution Take 1.25 mg by nebulization 4 (four) times daily as needed for wheezing or shortness of breath.     [provider]  loratadine (CLARITIN) 10 MG tablet Take 10 mg by mouth every evening.     [provider]  lurasidone (LATUDA) 20 MG TABS tablet Take 10-20 mg by mouth See admin instructions. Take 1/2 tablet in the morning and 1 tablet at bedtime    [provider]  magnesium oxide  (MAG-OX) 400 MG tablet Take 400 mg by mouth every other day.    [provider]  Melatonin 5 MG CHEW Chew 5 mg by mouth at bedtime.     [provider]  midazolam (VERSED) 5 MG/ML injection Place 5 mg into the nose as needed (For seizures lasting longer than 5 minutes). Draw up prescribed dose (ml) in syringe, remove blue vial access device, then attach syringe to nasal atomizer for intranasal administration.    [provider]  Multiple Vitamin (MULTIVITAMIN WITH MINERALS) TABS tablet Take 2 tablets by mouth daily.    [provider]  mupirocin ointment (BACTROBAN) 2 % Place 1 application into the nose 3 (three) times daily as needed (Rash or skin breakdown).    [provider]  nystatin (MYCOSTATIN/NYSTOP) powder Apply 1 g topically 3 (three) times daily as needed (yeast infection).    [provider]  omeprazole (PRILOSEC) 20 MG capsule Take 20 mg by mouth daily.    [provider]  polyethylene glycol powder (GLYCOLAX/MIRALAX) powder Take 17 g by mouth daily.     [provider]  PRESCRIPTION MEDICATION Apply 1 application topically 2 (two) times daily as needed (Diaper Rash). Zinc ointment and Nystatin cream    [provider]  saccharomyces boulardii (FLORASTOR) 250 MG capsule Take 250 mg by mouth daily as needed (loose stool when on antibiotics).    [provider]  sertraline (ZOLOFT) 100 MG tablet Take 100 mg by mouth at bedtime.    [provider]  trimethoprim-polymyxin b (POLYTRIM) ophthalmic solution Place 1 drop into both eyes every 4 (four) hours. Patient not taking: Reported on 05/20/2018 08/15/17   Kellie ShropshireShrosbree, Emily J, PA-C      Allergies    Eryped [erythromycin], Reglan [metoclopramide], and Tape    Review of Systems   Review of Systems  Unable to perform ROS: Patient nonverbal  Gastrointestinal:  Positive for abdominal pain.    Physical Exam Updated Vital Signs BP 110/61 (BP  Location: Left Arm)   Pulse (!) 105   Temp 98.8 F (37.1 C) (Oral)   Resp 17   Ht 5\' 2"  (1.575 m)   Wt 55 kg   SpO2 100%   BMI 22.18 kg/m  Physical Exam Vitals and nursing note reviewed.   [age] year old male, resting comfortably and in no acute distress. Vital signs are significant for slightly elevated heart rate. Oxygen saturation is 100%, which is normal. Head is normocephalic and atraumatic. PERRLA, EOMI.  Neck is nontender and supple without adenopathy. Lungs are clear without rales, wheezes, or rhonchi. Chest is nontender. Heart has regular rate and rhythm without murmur. Abdomen: PEG tube present in the right upper quadrant, jejunostomy tube present in the left mid abdomen, cecostomy tube present in the right lower quadrant.  Abdomen is slightly tense and tender diffusely but no clear rebound tenderness  or guarding.  Peristalsis is hypoactive. Extremities have no cyanosis or edema. Skin is warm and dry without rash. Neurologic: Awake but nonverbal, cranial nerves grossly intact.  ED Results / Procedures / Treatments   Labs (all labs ordered are listed, but only abnormal results are displayed) Labs Reviewed  CBC WITH DIFFERENTIAL/PLATELET - Abnormal; Notable for the following components:      Result Value   WBC 21.0 (*)    Neutro Abs 18.1 (*)    Monocytes Absolute 1.6 (*)    Abs Immature Granulocytes 0.12 (*)    All other components within normal limits  COMPREHENSIVE METABOLIC PANEL - Abnormal; Notable for the following components:   Creatinine, Ser 0.58 (*)    Total Protein 5.4 (*)    Albumin 3.1 (*)    AST 13 (*)    Total Bilirubin 1.4 (*)    All other components within normal limits  CULTURE, BLOOD (ROUTINE X 2)  CULTURE, BLOOD (ROUTINE X 2)  LIPASE, BLOOD  LACTIC ACID, PLASMA  URINALYSIS, ROUTINE W REFLEX MICROSCOPIC  LACTIC ACID, PLASMA    EKG None  Radiology CT ABDOMEN PELVIS W CONTRAST  Result Date: 04/07/2022 CLINICAL DATA:  Abdominal pain, acute,  nonlocalized abdominal pain, fever, vomiting, recent cecostomy EXAM: CT ABDOMEN AND PELVIS WITH CONTRAST TECHNIQUE: Multidetector CT imaging of the abdomen and pelvis was performed using the standard protocol following bolus administration of intravenous contrast. RADIATION DOSE REDUCTION: This exam was performed according to the departmental dose-optimization program which includes automated exposure control, adjustment of the mA and/or kV according to patient size and/or use of iterative reconstruction technique. CONTRAST:  22mL OMNIPAQUE IOHEXOL 350 MG/ML SOLN COMPARISON:  Plain film 11/18/2020 FINDINGS: Lower chest: No acute abnormality Hepatobiliary: No focal hepatic abnormality. Gallbladder unremarkable. Pancreas: No focal abnormality or ductal dilatation. Spleen: No focal abnormality.  Normal size. Adrenals/Urinary Tract: No adrenal abnormality. No focal renal abnormality. No stones or hydronephrosis. Urinary bladder is unremarkable. Stomach/Bowel: Gastrostomy tube in the distal stomach. Jejunostomy tube in place. There is a right lower quadrant tube, presumably reported cecostomy tube. This terminates in the anterior abdominal wall. There is contrast seen throughout the peritoneum in the abdomen and pelvis, presumably injected through this tube which is no longer within the cecum. No evidence of bowel obstruction. Vascular/Lymphatic: No evidence of aneurysm or adenopathy. Reproductive: No visible focal abnormality. Other: Free fluid in the pelvis, likely related to ventriculoperitoneal shunt catheters which terminate in the pelvis. Contrast material also noted throughout the peritoneum as described above. Musculoskeletal: No acute bony abnormality. IMPRESSION: Presumed injection of contrast through the right lower quadrant cecostomy which is not currently located in the cecum, but terminates in the anterior right lower abdominal wall. Extravasated contrast is seen throughout the peritoneum. Gastrostomy and  jejunostomy tubes in place in expected position. These results were called by telephone at the time of interpretation on 04/07/2022 at 12:15 am to provider Dr. Preston Fleeting, who verbally acknowledged these results. Electronically Signed   By: Charlett Nose M.D.   On: 04/07/2022 00:26   DG ABDOMEN PEG TUBE LOCATION  Result Date: 04/06/2022 CLINICAL DATA:  Abdominal pain. Nonfunctioning cecostomy tube NG tube. EXAM: ABDOMEN - 1 VIEW COMPARISON:  10/19/2020. FINDINGS: Prominent loop of small bowel is noted in the left lower quadrant measuring 3.6 cm in diameter. There is mild gaseous distention of the descending colon. Contrast is present in a right lower quadrant loop of large bowel, possibly representing cecostomy. A presumed ventriculoperitoneal shunt courses over the left abdomen.  A second catheter terminates in the right lower quadrant. A PEG tube projects over the stomach. No radio-opaque calculi or other acute radiographic abnormality are seen. IMPRESSION: 1. Mildly dilated loop of small bowel in the left lower quadrant measuring up to 3.6 cm in diameter, possible ileus versus partial or early small bowel obstruction. 2. Gaseous distention of the descending colon. 3. Contrast in a right lower quadrant loop of bowel, possible cecostomy. Electronically Signed   By: Thornell Sartorius M.D.   On: 04/06/2022 22:14    Procedures Procedures    Medications Ordered in ED Medications  diatrizoate meglumine-sodium (GASTROGRAFIN) 66-10 % solution 30 mL (has no administration in time range)  lactated ringers bolus 1,000 mL (1,000 mLs Intravenous New Bag/Given 04/06/22 2233)  diatrizoate meglumine-sodium (GASTROGRAFIN) 66-10 % solution (  Given 04/06/22 2158)  iohexol (OMNIPAQUE) 350 MG/ML injection 60 mL (60 mLs Intravenous Contrast Given 04/07/22 0001)    ED Course/ Medical Decision Making/ A&P                           Medical Decision Making Risk OTC drugs. Prescription drug management.   Abdominal pain, fever,  vomiting not following up placement of cecostomy tube which appears to be malfunctioning.  I have reviewed and interpreted his laboratory tests, and my interpretation is mild elevation of total bilirubin which is not felt to be clinically significant, marked leukocytosis with left shift, normal hemoglobin, normal lactic acid level.  CT of abdomen and pelvis shows contrast extravasated in the peritoneum, cecostomy tube currently terminates in the right lower abdominal wall.  Gastrostomy and jejunostomy tubes are in appropriate position.  I have independently viewed these images, and independently discussed the findings with the radiologist.  Patient has peritonitis related to dislodged cecostomy tube and I have ordered antibiotics of ceftriaxone and metronidazole.  I have contacted the transfer service at [facility] which is currently not accepting transfers, but they are trying to reach the on-call pediatric surgeon to discuss the case with me.  I have reviewed his past records, and he had an ED visit on 04/02/2022 at which time abdominal x-rays showed appropriate placement of all tubes.  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  Patient has remained hemodynamically stable in the emergency department.  I have discussed the case with Dr. Clide Deutscher of the pediatric surgery service at [facility] who request patient be sent ED to ED so they could evaluate him.  I have also discussed the case with Dr. Tarri Abernethy the emergency department who agrees to accept the patient in transfer.  They are sending their transport vehicle to [place] to transfer him.  CT scans will be pushed so they can be visible at [address].  CRITICAL CARE Performed by: Dione Booze Total critical care time: 60 minutes Critical care time was exclusive of separately billable procedures and treating other  patients. Critical care was necessary to treat or prevent imminent or life-threatening deterioration. Critical care was time spent personally by me on the following activities: development of treatment plan with patient and/or surrogate as well as nursing, discussions with consultants, evaluation of patient's response to treatment, examination of patient, obtaining history from patient or surrogate, ordering and performing treatments and interventions, ordering and review of laboratory studies, ordering and review of radiographic studies, pulse oximetry and re-evaluation of patient's condition.  Final Clinical Impression(s) /  ED Diagnoses Final diagnoses:  Peritonitis (HCC)  Cecostomy tube dysfunction (HCC)  Serum total bilirubin elevated    Rx / DC Orders ED Discharge Orders     None         Dione Booze, MD 04/07/22 [phone]

## 2022-04-07 NOTE — ED Notes (Signed)
Ct called to place images on disk

## 2022-04-07 NOTE — ED Notes (Signed)
Per [facility]'s transport, it may be awhile until transport can pick pt up. This RN will communicate with CN to see if [facility] is available for transport.  Accepting MD [facility] # for report: [phone]

## 2022-04-11 LAB — CULTURE, BLOOD (ROUTINE X 2)
Culture: NO GROWTH
Culture: NO GROWTH
Special Requests: ADEQUATE

## 2022-12-15 ENCOUNTER — Emergency Department (HOSPITAL_COMMUNITY): Payer: MEDICAID

## 2022-12-15 ENCOUNTER — Other Ambulatory Visit: Payer: Self-pay

## 2022-12-15 ENCOUNTER — Emergency Department (HOSPITAL_COMMUNITY)
Admission: EM | Admit: 2022-12-15 | Discharge: 2022-12-15 | Disposition: A | Discharge status: Home / Self Care | Payer: MEDICAID | Attending: Emergency Medicine | Admitting: Emergency Medicine

## 2022-12-15 ENCOUNTER — Encounter (HOSPITAL_COMMUNITY): Payer: Self-pay

## 2022-12-15 DIAGNOSIS — T82598A Other mechanical complication of other cardiac and vascular devices and implants, initial encounter: Secondary | ICD-10-CM | POA: Insufficient documentation

## 2022-12-15 DIAGNOSIS — Y829 Unspecified medical devices associated with adverse incidents: Secondary | ICD-10-CM | POA: Diagnosis not present

## 2022-12-15 DIAGNOSIS — T829XXA Unspecified complication of cardiac and vascular prosthetic device, implant and graft, initial encounter: Secondary | ICD-10-CM

## 2022-12-15 MED ORDER — ALTEPLASE 2 MG IJ SOLR
2.0000 mg | Freq: Once | INTRAMUSCULAR | Status: AC
  Administered 2022-12-15: 2 mg
  Filled 2022-12-15: qty 2

## 2022-12-15 MED ORDER — STERILE WATER FOR INJECTION IJ SOLN
INTRAMUSCULAR | Status: AC
  Administered 2022-12-15: 10 mL
  Filled 2022-12-15: qty 10
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
## 2022-12-15 NOTE — ED Triage Notes (Signed)
Pt presenting with bilumen power port with a clogged left side.

## 2022-12-15 NOTE — Progress Notes (Signed)
Tpa remved both ports after 2hour dwell. Medial port remained sluggish, re-accessed  With good blood return. Now both ports with good blood return.

## 2022-12-15 NOTE — Discharge Instructions (Addendum)
It was a pleasure taking care of you today. Return to the ER for new or worsening symptoms.

## 2023-01-29 ENCOUNTER — Emergency Department (HOSPITAL_COMMUNITY)
Admission: EM | Admit: 2023-01-29 | Discharge: 2023-01-30 | Disposition: A | Discharge status: Other Acute Inpatient Hospital | Payer: MEDICAID | Attending: Emergency Medicine | Admitting: Emergency Medicine

## 2023-01-29 ENCOUNTER — Encounter (HOSPITAL_COMMUNITY): Payer: Self-pay

## 2023-01-29 ENCOUNTER — Emergency Department (HOSPITAL_COMMUNITY): Payer: MEDICAID

## 2023-01-29 ENCOUNTER — Other Ambulatory Visit: Payer: Self-pay

## 2023-01-29 DIAGNOSIS — K219 Gastro-esophageal reflux disease without esophagitis: Secondary | ICD-10-CM | POA: Diagnosis present

## 2023-01-29 DIAGNOSIS — J90 Pleural effusion, not elsewhere classified: Secondary | ICD-10-CM | POA: Insufficient documentation

## 2023-01-29 DIAGNOSIS — R509 Fever, unspecified: Secondary | ICD-10-CM

## 2023-01-29 DIAGNOSIS — Z20822 Contact with and (suspected) exposure to covid-19: Secondary | ICD-10-CM | POA: Diagnosis not present

## 2023-01-29 DIAGNOSIS — F5101 Primary insomnia: Secondary | ICD-10-CM | POA: Diagnosis not present

## 2023-01-29 DIAGNOSIS — G4700 Insomnia, unspecified: Secondary | ICD-10-CM | POA: Insufficient documentation

## 2023-01-29 DIAGNOSIS — R6510 Systemic inflammatory response syndrome (SIRS) of non-infectious origin without acute organ dysfunction: Secondary | ICD-10-CM | POA: Insufficient documentation

## 2023-01-29 DIAGNOSIS — F411 Generalized anxiety disorder: Secondary | ICD-10-CM | POA: Diagnosis not present

## 2023-01-29 DIAGNOSIS — R079 Chest pain, unspecified: Secondary | ICD-10-CM | POA: Diagnosis present

## 2023-01-29 DIAGNOSIS — J45909 Unspecified asthma, uncomplicated: Secondary | ICD-10-CM | POA: Insufficient documentation

## 2023-01-29 DIAGNOSIS — G4733 Obstructive sleep apnea (adult) (pediatric): Secondary | ICD-10-CM | POA: Insufficient documentation

## 2023-01-29 DIAGNOSIS — R569 Unspecified convulsions: Secondary | ICD-10-CM

## 2023-01-29 LAB — I-STAT CG4 LACTIC ACID, ED: Lactic Acid, Venous: 0.9 mmol/L (ref 0.5–1.9)

## 2023-01-29 MED ORDER — METRONIDAZOLE 500 MG/100ML IV SOLN
500.0000 mg | Freq: Once | INTRAVENOUS | Status: AC
  Administered 2023-01-30: 500 mg via INTRAVENOUS
  Filled 2023-01-29: qty 100

## 2023-01-29 MED ORDER — LACTATED RINGERS IV SOLN: INTRAVENOUS | Status: DC

## 2023-01-29 MED ORDER — VANCOMYCIN HCL IN DEXTROSE 1-5 GM/200ML-% IV SOLN: 1000.0000 mg | Freq: Once | INTRAVENOUS | Status: DC

## 2023-01-29 MED ORDER — SODIUM CHLORIDE 0.9% FLUSH: 10.0000 mL | INTRAVENOUS | Status: DC | PRN

## 2023-01-29 MED ORDER — LACTATED RINGERS IV BOLUS (SEPSIS)
1000.0000 mL | Freq: Once | INTRAVENOUS | Status: AC
  Administered 2023-01-29: 1000 mL via INTRAVENOUS

## 2023-01-29 MED ORDER — ACETAMINOPHEN 325 MG PO TABS
650.0000 mg | ORAL_TABLET | Freq: Once | ORAL | Status: AC
  Administered 2023-01-29: 650 mg via ORAL
  Filled 2023-01-29: qty 2

## 2023-01-29 MED ORDER — VANCOMYCIN HCL 1250 MG/250ML IV SOLN
1250.0000 mg | Freq: Once | INTRAVENOUS | Status: AC
  Administered 2023-01-30: 1250 mg via INTRAVENOUS
  Filled 2023-01-29: qty 250

## 2023-01-29 MED ORDER — CHLORHEXIDINE GLUCONATE CLOTH 2 % EX PADS
6.0000 | MEDICATED_PAD | Freq: Every day | CUTANEOUS | Status: DC
  Administered 2023-01-30: 6 via TOPICAL

## 2023-01-29 MED ORDER — SODIUM CHLORIDE 0.9 % IV SOLN
2.0000 g | Freq: Once | INTRAVENOUS | Status: AC
  Administered 2023-01-29: 2 g via INTRAVENOUS
  Filled 2023-01-29: qty 12.5

## 2023-01-29 MED ORDER — LACTATED RINGERS IV BOLUS (SEPSIS)
1000.0000 mL | Freq: Once | INTRAVENOUS | Status: AC
  Administered 2023-01-30: 1000 mL via INTRAVENOUS

## 2023-01-29 NOTE — Progress Notes (Signed)
A consult was received from an ED physician for Vancomycin and Cefepime per pharmacy dosing.  The patient's profile has been reviewed for ht/wt/allergies/indication/available labs.    A one time order has been placed for Vancomycin 1250mg  IV and Cefepime 2gm IV.    Further antibiotics/pharmacy consults should be ordered by admitting physician if indicated.                       Thank you, Maryellen Pile, PharmD 01/29/2023  11:12 PM

## 2023-01-29 NOTE — ED Triage Notes (Signed)
Pt reports with a fever since last night. Pts mom reports not giving him anything for his fever. Pt has a g tube, j tube, and power port.

## 2023-01-30 ENCOUNTER — Emergency Department (HOSPITAL_COMMUNITY): Payer: MEDICAID

## 2023-01-30 DIAGNOSIS — R509 Fever, unspecified: Secondary | ICD-10-CM | POA: Diagnosis not present

## 2023-01-30 DIAGNOSIS — F411 Generalized anxiety disorder: Secondary | ICD-10-CM | POA: Diagnosis not present

## 2023-01-30 DIAGNOSIS — R569 Unspecified convulsions: Secondary | ICD-10-CM

## 2023-01-30 DIAGNOSIS — G4733 Obstructive sleep apnea (adult) (pediatric): Secondary | ICD-10-CM | POA: Insufficient documentation

## 2023-01-30 DIAGNOSIS — F5101 Primary insomnia: Secondary | ICD-10-CM | POA: Diagnosis not present

## 2023-01-30 DIAGNOSIS — J45909 Unspecified asthma, uncomplicated: Secondary | ICD-10-CM | POA: Insufficient documentation

## 2023-01-30 DIAGNOSIS — K219 Gastro-esophageal reflux disease without esophagitis: Secondary | ICD-10-CM | POA: Diagnosis not present

## 2023-01-30 DIAGNOSIS — G4700 Insomnia, unspecified: Secondary | ICD-10-CM | POA: Insufficient documentation

## 2023-01-30 LAB — URINALYSIS, W/ REFLEX TO CULTURE (INFECTION SUSPECTED)
Bilirubin Urine: NEGATIVE
Glucose, UA: NEGATIVE mg/dL
Hgb urine dipstick: NEGATIVE
Ketones, ur: NEGATIVE mg/dL
Leukocytes,Ua: NEGATIVE
Nitrite: NEGATIVE
Protein, ur: NEGATIVE mg/dL
Specific Gravity, Urine: 1.003 — ABNORMAL LOW (ref 1.005–1.030)
pH: 7 (ref 5.0–8.0)

## 2023-01-30 LAB — CBC WITH DIFFERENTIAL/PLATELET
Abs Immature Granulocytes: 0.03 10*3/uL (ref 0.00–0.07)
Basophils Absolute: 0 10*3/uL (ref 0.0–0.1)
Basophils Relative: 0 %
Eosinophils Absolute: 0 10*3/uL (ref 0.0–0.5)
Eosinophils Relative: 1 %
HCT: 38.5 % — ABNORMAL LOW (ref 39.0–52.0)
Hemoglobin: 12.4 g/dL — ABNORMAL LOW (ref 13.0–17.0)
Immature Granulocytes: 1 %
Lymphocytes Relative: 21 %
Lymphs Abs: 1.2 10*3/uL (ref 0.7–4.0)
MCH: 27.4 pg (ref 26.0–34.0)
MCHC: 32.2 g/dL (ref 30.0–36.0)
MCV: 85.2 fL (ref 80.0–100.0)
Monocytes Absolute: 0.7 10*3/uL (ref 0.1–1.0)
Monocytes Relative: 12 %
Neutro Abs: 3.6 10*3/uL (ref 1.7–7.7)
Neutrophils Relative %: 65 %
Platelets: 178 10*3/uL (ref 150–400)
RBC: 4.52 MIL/uL (ref 4.22–5.81)
RDW: 14 % (ref 11.5–15.5)
WBC: 5.4 10*3/uL (ref 4.0–10.5)
nRBC: 0 % (ref 0.0–0.2)

## 2023-01-30 LAB — RESP PANEL BY RT-PCR (RSV, FLU A&B, COVID)  RVPGX2
Influenza A by PCR: NEGATIVE
Influenza B by PCR: NEGATIVE
Resp Syncytial Virus by PCR: NEGATIVE
SARS Coronavirus 2 by RT PCR: NEGATIVE

## 2023-01-30 LAB — PROTIME-INR
INR: 1.1 (ref 0.8–1.2)
Prothrombin Time: 14.3 s (ref 11.4–15.2)

## 2023-01-30 LAB — COMPREHENSIVE METABOLIC PANEL
ALT: 32 U/L (ref 0–44)
AST: 25 U/L (ref 15–41)
Albumin: 3.6 g/dL (ref 3.5–5.0)
Alkaline Phosphatase: 88 U/L (ref 38–126)
Anion gap: 9 (ref 5–15)
BUN: 11 mg/dL (ref 6–20)
CO2: 25 mmol/L (ref 22–32)
Calcium: 8.8 mg/dL — ABNORMAL LOW (ref 8.9–10.3)
Chloride: 103 mmol/L (ref 98–111)
Creatinine, Ser: 0.53 mg/dL — ABNORMAL LOW (ref 0.61–1.24)
GFR, Estimated: >60 mL/min (ref >60)
Glucose, Bld: 99 mg/dL (ref 70–99)
Potassium: 3.6 mmol/L (ref 3.5–5.1)
Sodium: 137 mmol/L (ref 135–145)
Total Bilirubin: 0.6 mg/dL (ref 0.3–1.2)
Total Protein: 6.8 g/dL (ref 6.5–8.1)

## 2023-01-30 LAB — APTT: aPTT: 32 s (ref 24–36)

## 2023-01-30 MED ORDER — ACETAMINOPHEN 500 MG PO TABS
1000.0000 mg | ORAL_TABLET | Freq: Four times a day (QID) | ORAL | Status: DC | PRN
  Administered 2023-01-30: 500 mg via ORAL
  Filled 2023-01-30: qty 2

## 2023-01-30 MED ORDER — IOHEXOL 300 MG/ML  SOLN
75.0000 mL | Freq: Once | INTRAMUSCULAR | Status: AC | PRN
  Administered 2023-01-30: 75 mL via INTRAVENOUS

## 2023-01-30 NOTE — ED Notes (Signed)
Pt is on [facility]'s transfer list
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
## 2023-01-30 NOTE — Consult Note (Addendum)
[facility] Medical Consultation  Jonathan Booker ZOX:[id number] DOB: 2000-08-17 DOA: 01/29/2023 PCP: Garey Ham, MD   Requesting physician: Berle Mull, PA-C Date of consultation: 01/30/2023 Reason for consultation: Fever of unknown origin  Impression/Recommendations Fever of unknown origin -Patient presenting with complaining of fever for last 2 days.  Patient's mother at the bedside concerned that patient possibly has VA shunt infection like patient had the VP shunt infection in June 2024. - Patient has high temperature 101.4 F.  CBC, CMP, lactic acid grossly unremarkable.  UA unremarkable. -Chest x-ray small right pleural effusion from [place] shunt - CTA chest Right ventricular shunt terminates in the right pleural space with small to moderate right pleural effusion. Compressive atelectasis in the right lower lobe. [facility] Neurology Dr. Kathrynn Speed.  Per neurology as no sign of meningitis and patient does not have any lactic acidosis nor leukocytosis, so there is no suspicion for meningitis at this time. - At this time unknown source of origin of fever. -Recommending to continue  broad-spectrum antibiotic coverage with IV vancomycin and cefepime with pharmacy consult. And follow-up with blood culture for appropriate antibiotic coverage.   -If patient develops leukocytosis, lactic acidosis, altered mentation and meningeal sign in that case need to perform lumbar puncture and MRI head with or without contrast. -As per patient's mother request informed ER provider to reach out to [facility] and [facility] for recommendation and patient's mother requesting possible transfer to [facility] as well. -Continue to monitor fever curve and CBC.   History of hydrocephalus s/p VP shunt with has been changed to [place] shunt in June 2024 -Patient's mother reported VP shunt was placed in in childhood which recently got infected in June 2024 and VP shunt was removed and patient  underwent craniectomy as well in that process per patient's mother report at the bedside. -Currently has VA shunt. -Patient follows [facility] neurosurgery.  Reactive airway disease -Recommended to continue Pulmicort once daily, fluticasone inhaler 2 puff at bedtime, and albuterol as needed. -Continue loratadine 10 mg at bedtime - Continue to check pulse ox and supplemental oxygen to keep O2 sat above 92% -Continue Flonase nasal spray   History of seizure - Recommending to continue Lamictal 150 mg twice daily  Insomnia -Continue melatonin 5 mg at bedtime  Anxiety disorder - Recommending to  to continue Xanax 1 mg at bedtime as needed.  OSA - Recommending to continue supplemental oxygen at bedtime.  GERD - Recommending to continue Prilosec and Tums as needed  History of gastroparesis - Patient has history of gastroparesis with recurrent GI shutdown per patient's mother report and patient has G-tube for venting and J-tube for food and medication for the case of emergency.  Other wise patient takes all medication and food by mouth. -Continue all the medication by mouth for now.  Chronic constipation - Continue monitor Motegrity 2 mg twice daily.   Chief Complaint: Fever.  HPI:  Jonathan Booker is a 22 y.o. male with past medical history of hydrocephalus s/p  VP shunt which has been switched to [place] shunt in June 2024 due to VP shunt related infection, legal blindness, gastrostomy and jejunostomy tube placement for emergent use in setting of gastroparesis, reactive airway disease, generalized anxiety disorder, insomnia, diaper rash, and GERD presented to emergency department for evaluation for fever.  His mother at the bedside reported that patient has fever for last 1 day and she is worried that patient might have VA shunt related infection. Per patient's mother report patient  was admitted at [facility] in June 2024 with VP shunt infection while he was treated with 6 weeks of vancomycin and  cefepime and later transition to 3 weeks of cefdinir. VP shunt has been converted to be VA shunt. Patient's mom reported that when patient's GI system shards down in the setting of gastroparesis she uses G-tube venting of the stomach and J-tube for food and medications. Patient is able to communicate with a keyboard and able to answer my questions. During my evaluation patient reported he has generalized headache.  Denies any neck stiffness and neck pain.  Denies any chest pain, cough, sinus pain, sinus pressure, nasal discharge, sore throat, palpitation, abdominal pain, nausea, vomiting and diarrhea.  Patient's mother requested to reach out to [facility] and [facility]surgery for further recommendation and if needed she wants patient to be transferred over to [facility] as well.  At presentation to ED patient found to have high temperature of 101 0.73 Fahrenheit, tachycardic 104, respiratory 20, blood pressure 146/77 O2 sat 99% room air.  Due to high fever blood cultures has been obtained which is in process.CMP grossly unremarkable except low creatinine 0.53 and low calcium 8.8. CBC grossly unremarkable except evidence of chronic anemia stable H&H 12.4 and 58.  Lactic acid 0.9. Respiratory panel negative. Chest x-ray showed left-sided Port-A-Cath with tip at the cavoatrial junction.  Partially visualized VP shunt over the right chest.  No focal consolidation.  Small right pleural effusion.  No pneumothorax.  Stable cardiac shallowed.  No acute osseous process.  CT chest: IMPRESSION: Right ventricular shunt terminates in the right pleural space with small to moderate right pleural effusion. Compressive atelectasis in the right lower lobe.  In the ED with the concern for infection in the setting of fever patient has been started treating with vancomycin and cefepime.  Also received 2 L of LR bolus.  As unable to pinpoint because of fever at this time and in the setting of headache I have  some suspicion of meningitis.  ED physician discussed case with with on-call neurology Dr.Khaliqdina.  Per neurology no concern for meningitis as patient does not have any elevated WBC count, lactic acidosis and no sign of meningitis on physical exam.  Given patient's mother requesting for consultation to do infectious disease and neurosurgery and possible [facility] transfer holding up admission to [facility] at this time.   Recommending to continue IV antibiotic and will follow-up with blood culture for appropriate antibiotic coverage.  If patient develop leukocytosis, lactic acidosis and meningeal sign in that case need to perform lumbar puncture as well as need to obtain MRI of the head with and without contrast.  Review of Systems: Review of Systems  Constitutional:  Positive for fever. Negative for chills and malaise/fatigue.  Eyes:        Fortunately patient is legally blind  Respiratory:  Negative for cough, sputum production, shortness of breath and wheezing.   Cardiovascular:  Negative for chest pain and palpitations.  Gastrointestinal:  Negative for abdominal pain, heartburn, nausea and vomiting.  Genitourinary:  Negative for dysuria, frequency and urgency.  Musculoskeletal:  Negative for back pain, joint pain, myalgias and neck pain.  Neurological:  Positive for headaches. Negative for dizziness.  Endo/Heme/Allergies:  Negative for environmental allergies and polydipsia. Does not bruise/bleed easily.  Psychiatric/Behavioral:  The patient is not nervous/anxious.     Past Medical History:  Diagnosis Date   Anxiety    Asthma    Blind    Cerebral  palsy (HCC)    Cortical deafness    Diarrhea    Gastroesophageal reflux    Seizures (HCC)    Shaken baby syndrome    Past Surgical History:  Procedure Laterality Date   CRANIOTOMY     GASTROSTOMY-JEJEUNOSTOMY TUBE CHANGE/PLACEMENT     LEG SURGERY     TYMPANOSTOMY TUBE PLACEMENT     VENTRICULOPERITONEAL SHUNT     Social  History:  reports that he has never smoked. He has never used smokeless tobacco. He reports that he does not drink alcohol and does not use drugs.  Allergies  Allergen Reactions   Eryped [Erythromycin]    Reglan [Metoclopramide]    Other Rash    CHG wipes   Tape Hives, Rash and Itching    Other reaction(s): Other (See Comments) Sensitive to tape; peels skin off  Sensitive to tape; peels skin off Sensitive to tape; peels skin off Sensitive to tape; peels skin off   Family History  Adopted: Yes    Prior to Admission medications   Medication Sig Start Date End Date Taking? Authorizing Provider  acetaminophen (TYLENOL) 160 MG/5ML solution Take 320 mg by mouth every 6 (six) hours as needed for mild pain, fever or headache.   Yes [provider]  albuterol (PROVENTIL HFA;VENTOLIN HFA) 108 (90 BASE) MCG/ACT inhaler Inhale 1-2 puffs into the lungs every 6 (six) hours as needed for wheezing or shortness of breath.    Yes [provider]  albuterol (PROVENTIL) (2.5 MG/3ML) 0.083% nebulizer solution Take 2.5 mg by nebulization every 6 (six) hours as needed for wheezing or shortness of breath.   Yes [provider]  ALPRAZolam Prudy Feeler) 1 MG tablet Take 1 mg by mouth daily as needed for anxiety or sleep.   Yes [provider]  budesonide (PULMICORT) 0.5 MG/2ML nebulizer solution Take 1 mL by nebulization at bedtime. 1mL in each nostril   Yes [provider]  calcium carbonate (TUMS EX) 750 MG chewable tablet Chew 1 tablet by mouth 2 (two) times daily as needed for heartburn.    Yes [provider]  calcium carbonate (TUMS) 500 MG chewable tablet Chew 1 tablet by mouth at bedtime.   Yes [provider]  cholecalciferol (VITAMIN D) 25 MCG (1000 UT) tablet Take 1,000 Units by mouth at bedtime.   Yes [provider]  diphenhydrAMINE (BENADRYL) 2 % cream Apply 1 application topically 2 (two) times daily as needed for itching.    Yes  [provider]  fluticasone (FLONASE) 50 MCG/ACT nasal spray Place 1 spray into both nostrils daily.   Yes [provider]  fluticasone (FLOVENT HFA) 110 MCG/ACT inhaler Inhale 2 puffs into the lungs at bedtime.    Yes [provider]  ipratropium (ATROVENT) 0.06 % nasal spray Place 1-2 sprays into the nose 4 (four) times daily as needed (runny nose).    Yes [provider]  lamoTRIgine (LAMICTAL) 150 MG tablet Take 150 mg by mouth 2 (two) times daily.   Yes [provider]  loratadine (CLARITIN) 10 MG tablet Take 10 mg by mouth at bedtime.   Yes [provider]  magnesium oxide (MAG-OX) 400 MG tablet Take 400 mg by mouth at bedtime.   Yes [provider]  Melatonin 5 MG CHEW Chew 5 mg by mouth at bedtime.   Yes [provider]  midazolam (VERSED) 5 MG/ML injection Place 5 mg into the nose as needed (For seizures lasting longer than 5 minutes). Draw up  prescribed dose (ml) in syringe, remove blue vial access device, then attach syringe to nasal atomizer for intranasal administration.   Yes [provider]  MOTEGRITY 2 MG TABS Take 2 mg by mouth in the morning and at bedtime.   Yes [provider]  mupirocin ointment (BACTROBAN) 2 % Place 1 application into the nose 3 (three) times daily as needed (Rash or skin breakdown).   Yes [provider]  NAYZILAM 5 MG/0.1ML SOLN USE 1 SPRAY IN LEFT NOSTRIL ONCE A DAY AS NEEDED FOR SEIZURE > 5 MINUTES FOR UP TO 1 DOSE 07/02/20  Yes [provider]  nystatin (MYCOSTATIN/NYSTOP) powder Apply 1 g topically 3 (three) times daily as needed (yeast infection).   Yes [provider]  omeprazole (PRILOSEC) 40 MG capsule Take 40 mg by mouth daily.   Yes [provider]  polyethylene glycol powder (GLYCOLAX/MIRALAX) powder Take 17 g by mouth in the morning and at bedtime.   Yes [provider]  trimethoprim-polymyxin b (POLYTRIM) ophthalmic  solution Place 1 drop into both eyes every 4 (four) hours. Patient not taking: Reported on 05/20/2018 08/15/17   Kellie Shropshire, PA-C   Physical Exam:  Constitutional: NAD, calm, comfortable Vitals:   01/30/23 0005 01/30/23 0039 01/30/23 0347 01/30/23 0514  BP:  123/77 124/70 124/88  Pulse:  91 91 81  Resp:  16 16 16   Temp: 98.8 F (37.1 C)  99.2 F (37.3 C) 98.8 F (37.1 C)  TempSrc: Oral  Oral Oral  SpO2:  97% 98% 100%  Weight:      Height:       Eyes: PERRL, lids and conjunctivae normal ENMT: Mucous membranes are moist. Posterior pharynx clear of any exudate or lesions.Normal dentition.  Neck: normal, supple, no masses, no thyromegaly Respiratory: clear to auscultation bilaterally, no wheezing, no crackles. Normal respiratory effort. No accessory muscle use.  Cardiovascular: Regular rate and rhythm, no murmurs / rubs / gallops. No extremity edema. 2+ pedal pulses. No carotid bruits.  Abdomen: no tenderness, no masses palpated. No hepatosplenomegaly. Bowel sounds positive.  Musculoskeletal: no clubbing / cyanosis. No joint deformity upper and lower extremities. Good ROM. Normal muscle tone.  Skin: no rashes, lesions, ulcers. No induration Neurologic: CN 2-12 grossly intact. Sensation intact, DTR normal. Strength 5/5 in all 4.  Psychiatric: Unable to assess  Labs on Admission:  Basic Metabolic Panel: Recent Labs  Lab 01/29/23 2330  NA 137  K 3.6  CL 103  CO2 25  GLUCOSE 99  BUN 11  CREATININE 0.53*  CALCIUM 8.8*   Liver Function Tests: Recent Labs  Lab 01/29/23 2330  AST 25  ALT 32  ALKPHOS 88  BILITOT 0.6  PROT 6.8  ALBUMIN 3.6   No results for input(s): "LIPASE", "AMYLASE" in the last 168 hours. No results for input(s): "AMMONIA" in the last 168 hours. CBC: Recent Labs  Lab 01/29/23 2330  WBC 5.4  NEUTROABS 3.6  HGB 12.4*  HCT 38.5*  MCV 85.2  PLT 178   Cardiac Enzymes: No results for input(s): "CKTOTAL", "CKMB", "CKMBINDEX", "TROPONINI" in  the last 168 hours. BNP: Invalid input(s): "POCBNP" CBG: No results for input(s): "GLUCAP" in the last 168 hours.  Radiological Exams on Admission: CT Chest W Contrast  Result Date: 01/30/2023 CLINICAL DATA:  Chest wall pain.  No known trauma. EXAM: CT CHEST WITH CONTRAST TECHNIQUE: Multidetector CT imaging of the chest was performed during intravenous contrast administration. RADIATION DOSE REDUCTION: This exam was performed according to the  departmental dose-optimization program which includes automated exposure control, adjustment of the mA and/or kV according to patient size and/or use of iterative reconstruction technique. CONTRAST:  75mL OMNIPAQUE IOHEXOL 300 MG/ML  SOLN COMPARISON:  X-ray today. FINDINGS: Cardiovascular: Heart is normal size. Aorta is normal caliber. Left chest wall Port-A-Cath in place with the tip at the cavoatrial junction. Mediastinum/Nodes: No mediastinal, hilar, or axillary adenopathy. Trachea and esophagus are unremarkable. Thyroid unremarkable. Lungs/Pleura: Right ventricular shunt terminates in the right pleural space. Small to moderate right pleural effusion. Compressive atelectasis in the right lower lobe. Left lung clear. Upper Abdomen: No acute findings Musculoskeletal: Chest wall soft tissues are unremarkable. No acute bony abnormality. IMPRESSION: Right ventricular shunt terminates in the right pleural space with small to moderate right pleural effusion. Compressive atelectasis in the right lower lobe. Electronically Signed   By: Charlett Nose M.D.   On: 01/30/2023 02:03   DG Chest Port 1 View  Result Date: 01/29/2023 CLINICAL DATA:  Sepsis. EXAM: PORTABLE CHEST 1 VIEW COMPARISON:  Chest radiograph dated 12/15/2022. FINDINGS: Left-sided Port-A-Cath with tip at the cavoatrial junction. Partially visualized VP shunt over the right chest. No focal consolidation. Small right pleural effusion. No pneumothorax. Stable cardiac silhouette no acute osseous pathology.  IMPRESSION: Small right pleural effusion. No focal consolidation. Electronically Signed   By: Elgie Collard M.D.   On: 01/29/2023 22:32    EKG:  EKG shows sinus tachycardia heart rate 101.  There is no ST and T wave abnormality.    Tereasa Coop MD [facility]  How to contact the [facility] Attending or Consulting provider 7A - 7P or covering provider during after hours 7P -7A, for this patient?   Check the care team in [facility] and look for a) attending/consulting [facility] provider listed and b) the [facility] team listed Log into [email or web address] and use [facility]'s universal password to access. If you do not have the password, please contact the hospital operator. Locate the [facility] provider you are looking for under [facility] and page to a number that you can be directly reached. If you still have difficulty reaching the provider, please page the [facility] (Director on Call) for the Hospitalists listed on amion for assistance.  01/30/2023, 7:02 AM

## 2023-01-30 NOTE — ED Provider Notes (Signed)
[place] EMERGENCY DEPARTMENT AT [facility] Provider Note   CSN: [id number] Arrival date & time: 01/29/23  2142     History  Chief Complaint  Patient presents with   Fever    Jonathan Booker is a 22 y.o. male.  HPI   Patient with medical history including traumatic brain injury, trach and magery syndrome, VP shunt, gastroparesis, with J-tube present, cystostomy tube, presenting with fevers.  Mother is at bedside provided HPI, states that patient developed a fever earlier today, states that the patient has been acting slightly more tired but there has been no other change in mental status, she denies any cough congestion patient endorsing any chest pain stomach pains nausea vomiting, states he has been tolerating p.o., has been having good bowel movements, no foul-smelling urine or decreased urine output.  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    Home Medications Prior to Admission medications   Medication Sig Start Date End Date Taking? Authorizing Provider  acetaminophen (TYLENOL) 160 MG/5ML solution Take 320 mg by mouth every 6 (six) hours as needed for mild pain, fever or headache.   Yes [provider]  albuterol (PROVENTIL HFA;VENTOLIN HFA) 108 (90 BASE) MCG/ACT inhaler Inhale 1-2 puffs into the lungs every 6 (six) hours as needed for wheezing or shortness of breath.    Yes [provider]  albuterol (PROVENTIL) (2.5 MG/3ML) 0.083% nebulizer solution Take 2.5 mg by nebulization every 6 (six) hours as needed for wheezing or shortness of breath.   Yes [provider]  ALPRAZolam Prudy Feeler) 1 MG tablet Take 1 mg by mouth daily as needed for anxiety or sleep.   Yes [provider]  budesonide (PULMICORT) 0.5  MG/2ML nebulizer solution Take 1 mL by nebulization at bedtime. 1mL in each nostril   Yes [provider]  calcium carbonate (TUMS EX) 750 MG chewable tablet Chew 1 tablet by mouth 2 (two) times daily as needed for heartburn.    Yes [provider]  calcium carbonate (TUMS) 500 MG chewable tablet Chew 1 tablet by mouth at bedtime.   Yes [provider]  cholecalciferol (VITAMIN D) 25 MCG (1000 UT) tablet Take 1,000 Units by mouth at bedtime.   Yes [provider]  diphenhydrAMINE (BENADRYL) 2 % cream Apply 1 application topically 2 (two) times daily as needed for itching.    Yes [provider]  fluticasone (FLONASE) 50 MCG/ACT nasal spray Place 1 spray into both nostrils daily.   Yes [provider]  fluticasone (FLOVENT HFA) 110 MCG/ACT inhaler Inhale 2 puffs into the lungs at bedtime.    Yes [provider]  ipratropium (ATROVENT) 0.06 % nasal spray Place 1-2 sprays into the nose 4 (four) times daily as needed (runny nose).    Yes [provider]  lamoTRIgine (LAMICTAL) 150 MG tablet Take 150 mg by mouth 2 (two) times daily.   Yes [provider]  loratadine (CLARITIN) 10 MG tablet Take 10 mg by mouth at bedtime.   Yes [provider]  magnesium oxide (MAG-OX) 400 MG tablet Take 400 mg by mouth at bedtime.   Yes [provider]  Melatonin 5 MG CHEW Chew 5 mg by mouth at bedtime.   Yes [provider]  midazolam (VERSED) 5 MG/ML injection  Place 5 mg into the nose as needed (For seizures lasting longer than 5 minutes). Draw up prescribed dose (ml) in syringe, remove blue vial access device, then attach syringe to nasal atomizer for intranasal administration.   Yes [provider]  MOTEGRITY 2 MG TABS Take 2 mg by mouth in the morning and at bedtime.   Yes [provider]  mupirocin ointment (BACTROBAN) 2 % Place 1 application into the nose 3 (three) times daily as needed (Rash  or skin breakdown).   Yes [provider]  NAYZILAM 5 MG/0.1ML SOLN USE 1 SPRAY IN LEFT NOSTRIL ONCE A DAY AS NEEDED FOR SEIZURE > 5 MINUTES FOR UP TO 1 DOSE 07/02/20  Yes [provider]  nystatin (MYCOSTATIN/NYSTOP) powder Apply 1 g topically 3 (three) times daily as needed (yeast infection).   Yes [provider]  omeprazole (PRILOSEC) 40 MG capsule Take 40 mg by mouth daily.   Yes [provider]  polyethylene glycol powder (GLYCOLAX/MIRALAX) powder Take 17 g by mouth in the morning and at bedtime.   Yes [provider]  trimethoprim-polymyxin b (POLYTRIM) ophthalmic solution Place 1 drop into both eyes every 4 (four) hours. Patient not taking: Reported on 05/20/2018 08/15/17   Kellie Shropshire, PA-C      Allergies    Eryped [erythromycin], Reglan [metoclopramide], Other, and Tape    Review of Systems   Review of Systems  Unable to perform ROS: Patient nonverbal    Physical Exam Updated Vital Signs BP 124/88 (BP Location: Left Arm)   Pulse 81   Temp 98.8 F (37.1 C) (Oral)   Resp 16   Ht 5\' 4"  (1.626 m)   Wt 59.9 kg   SpO2 100%   BMI 22.66 kg/m  Physical Exam Vitals and nursing note reviewed.  Constitutional:      General: He is not in acute distress.    Appearance: He is not ill-appearing.  HENT:     Head: Normocephalic and atraumatic.     Comments: Patient has notable surgical scars on his head, wounds appear to be healing well, no evidence of infection on my exam.    Nose: No congestion.     Mouth/Throat:     Mouth: Mucous membranes are moist.     Pharynx: Oropharynx is clear. No oropharyngeal exudate or posterior oropharyngeal erythema.  Eyes:     Conjunctiva/sclera: Conjunctivae normal.  Cardiovascular:     Rate and Rhythm: Regular rhythm. Tachycardia present.     Pulses: Normal pulses.     Heart sounds: No murmur heard.    No friction rub. No gallop.  Pulmonary:     Effort: No respiratory distress.     Breath  sounds: No wheezing, rhonchi or rales.  Abdominal:     Palpations: Abdomen is soft.     Tenderness: There is no abdominal tenderness. There is no right CVA tenderness or left CVA tenderness.     Comments: Has noted J-tube as well as cystostomy tube no evidence of infection on my exam, abdomen is nondistended, soft, nontender my evaluation.  Skin:    General: Skin is warm and dry.  Neurological:     Mental Status: He is alert.     Comments: No facial asymmetry no obvious unilateral weakness.  Psychiatric:        Mood and Affect: Mood normal.     ED Results / Procedures / Treatments   Labs (all labs ordered are listed, but only abnormal results are displayed)  Labs Reviewed  COMPREHENSIVE METABOLIC PANEL - Abnormal; Notable for the following components:      Result Value   Creatinine, Ser 0.53 (*)    Calcium 8.8 (*)    All other components within normal limits  CBC WITH DIFFERENTIAL/PLATELET - Abnormal; Notable for the following components:   Hemoglobin 12.4 (*)    HCT 38.5 (*)    All other components within normal limits  URINALYSIS, W/ REFLEX TO CULTURE (INFECTION SUSPECTED) - Abnormal; Notable for the following components:   Color, Urine COLORLESS (*)    Specific Gravity, Urine 1.003 (*)    Bacteria, UA RARE (*)    All other components within normal limits  RESP PANEL BY RT-PCR (RSV, FLU A&B, COVID)  RVPGX2  CULTURE, BLOOD (ROUTINE X 2)  CULTURE, BLOOD (ROUTINE X 2)  PROTIME-INR  APTT  I-STAT CG4 LACTIC ACID, ED  I-STAT CG4 LACTIC ACID, ED    EKG EKG Interpretation Date/Time:  Sunday January 30 2023 00:13:20 EDT Ventricular Rate:  101 PR Interval:  134 QRS Duration:  97 QT Interval:  301 QTC Calculation: 391 R Axis:   84  Text Interpretation: Sinus tachycardia ST elev, probable normal early repol pattern Confirmed by Drema Pry [phone]) on 01/30/2023 12:16:16 AM  Radiology CT Chest W Contrast  Result Date: 01/30/2023 CLINICAL DATA:  Chest wall pain.  No  known trauma. EXAM: CT CHEST WITH CONTRAST TECHNIQUE: Multidetector CT imaging of the chest was performed during intravenous contrast administration. RADIATION DOSE REDUCTION: This exam was performed according to the departmental dose-optimization program which includes automated exposure control, adjustment of the mA and/or kV according to patient size and/or use of iterative reconstruction technique. CONTRAST:  75mL OMNIPAQUE IOHEXOL 300 MG/ML  SOLN COMPARISON:  X-ray today. FINDINGS: Cardiovascular: Heart is normal size. Aorta is normal caliber. Left chest wall Port-A-Cath in place with the tip at the cavoatrial junction. Mediastinum/Nodes: No mediastinal, hilar, or axillary adenopathy. Trachea and esophagus are unremarkable. Thyroid unremarkable. Lungs/Pleura: Right ventricular shunt terminates in the right pleural space. Small to moderate right pleural effusion. Compressive atelectasis in the right lower lobe. Left lung clear. Upper Abdomen: No acute findings Musculoskeletal: Chest wall soft tissues are unremarkable. No acute bony abnormality. IMPRESSION: Right ventricular shunt terminates in the right pleural space with small to moderate right pleural effusion. Compressive atelectasis in the right lower lobe. Electronically Signed   By: Charlett Nose M.D.   On: 01/30/2023 02:03   DG Chest Port 1 View  Result Date: 01/29/2023 CLINICAL DATA:  Sepsis. EXAM: PORTABLE CHEST 1 VIEW COMPARISON:  Chest radiograph dated 12/15/2022. FINDINGS: Left-sided Port-A-Cath with tip at the cavoatrial junction. Partially visualized VP shunt over the right chest. No focal consolidation. Small right pleural effusion. No pneumothorax. Stable cardiac silhouette no acute osseous pathology. IMPRESSION: Small right pleural effusion. No focal consolidation. Electronically Signed   By: Elgie Collard M.D.   On: 01/29/2023 22:32    Procedures .Critical Care  Performed by: Carroll Sage, PA-C Authorized by: Carroll Sage, PA-C   Critical care provider statement:    Critical care time (minutes):  75   Critical care time was exclusive of:  Separately billable procedures and treating other patients   Critical care was necessary to treat or prevent imminent or life-threatening deterioration of the following conditions:  Sepsis   Critical care was time spent personally by me on the following activities:  Development of treatment plan with patient or surrogate, discussions with consultants, evaluation of patient's response  to treatment, examination of patient, ordering and review of laboratory studies, ordering and review of radiographic studies, ordering and performing treatments and interventions, pulse oximetry, re-evaluation of patient's condition and review of old charts   I assumed direction of critical care for this patient from another provider in my specialty: no     Care discussed with: admitting provider       Medications Ordered in ED Medications  lactated ringers infusion ( Intravenous New Bag/Given 01/30/23 0158)  sodium chloride flush (NS) 0.9 % injection 10-40 mL (has no administration in time range)  Chlorhexidine Gluconate Cloth 2 % PADS 6 each (has no administration in time range)  lactated ringers bolus 1,000 mL (0 mLs Intravenous Stopped 01/30/23 0135)    And  lactated ringers bolus 1,000 mL (0 mLs Intravenous Stopped 01/30/23 0205)  ceFEPIme (MAXIPIME) 2 g in sodium chloride 0.9 % 100 mL IVPB (0 g Intravenous Stopped 01/30/23 0033)  metroNIDAZOLE (FLAGYL) IVPB 500 mg (0 mg Intravenous Stopped 01/30/23 0135)  vancomycin (VANCOREADY) IVPB 1250 mg/250 mL (0 mg Intravenous Stopped 01/30/23 0348)  acetaminophen (TYLENOL) tablet 650 mg (650 mg Oral Given 01/29/23 2335)  iohexol (OMNIPAQUE) 300 MG/ML solution 75 mL (75 mLs Intravenous Contrast Given 01/30/23 0135)    ED Course/ Medical Decision Making/ A&P                                 Medical Decision Making Amount and/or Complexity of  Data Reviewed Labs: ordered. Radiology: ordered. ECG/medicine tests: ordered.  Risk OTC drugs. Prescription drug management. Decision regarding hospitalization.   This patient presents to the ED for concern of fever, this involves an extensive number of treatment options, and is a complaint that carries with it a high risk of complications and morbidity.  The differential diagnosis includes URI, UTI, pneumonia, intra-abdominal infection,    Additional history obtained:  Additional history obtained from mother at bedside External records from outside source obtained and reviewed including recent hospitalization   Co morbidities that complicate the patient evaluation  TBI, VP shunt's,  Social Determinants of Health:  Disabled    Lab Tests:  I Ordered, and personally interpreted labs.  The pertinent results include: CBC shows normocytic anemia hemoglobin 12.4, CMP reveals creatinine 0.53, calcium 8.8, prothrombin time INR unremarkable, APTT unremarkable, respiratory panel negative, lactic 0.9   Imaging Studies ordered:  I ordered imaging studies including chest x-ray, CT of chest I independently visualized and interpreted imaging which showed shows right pleural effusion likely from VP shunt I agree with the radiologist interpretation   Cardiac Monitoring:  The patient was maintained on a cardiac monitor.  I personally viewed and interpreted the cardiac monitored which showed an underlying rhythm of: Without signs of ischemia   Medicines ordered and prescription drug management:  I ordered medication including Tylenol I have reviewed the patients home medicines and have made adjustments as needed  Critical Interventions:  Patient meets sepsis criteria, febrile, tachycardic, unclear source, will obtain sepsis workup, start on broad-spectrum antibiotics. Patient was reassessed after provide with fluids antibiotics, vital signs have improved, he is resting  comfortably.   Reevaluation:  Still no obvious source of infection, patient does have right pleural effusion, will further assess with CT imaging  CT imaging is unremarkable, will recommend admission for further observation  Mother states that patient is having a slight headache and states that this is how he presented when he had a an  infected shunt before, will consult with neurosurgery for further recommendations  Will admit to medicine  Hospital spoke with patient and family, mother is concerned that patient might have an infected shunt, and would like consultation and possible evaluation by [facility] for further assessment  Updated patient and family on these recommendations will transfer to [facility] for further assessment.  Consultations Obtained:  I requested consultation with the Dr. Baldwin [place] neurology,  and discussed lab and imaging findings as well as pertinent plan - they recommend: Agrees that current presentation does not seem consistent with meningitis, and recommends continue monitoring, hospitalist can consult neurology if concerns for meningitis  arises. Spoke with Dr. Francene Castle who will admit the patient Spoke with pediatric [facility] neurosurgery Dr.Matthew vestal who states patient can be transferred to [facility] ED for further assessment.    Test Considered:  CT abdomen pelvis-deferred as she has a nonsurgical abdomen.    Rule out low suspicion for lower lobe pneumonia as lung sounds are clear bilaterally, imaging is unremarkable, there is a noted pleural effusion right lung base this is likely for coming from the VP shunt.  I have low suspicion for liver or gallbladder abnormality as she has no right upper quadrant tenderness, liver enzymes, alk phos, T bili all within normal limits.  Low suspicion for pancreatitis as lipase is within normal limits.  Low suspicion for ruptured stomach ulcer as she has no peritoneal sign present on exam.  Low suspicion for bowel obstruction as  abdomen is nondistended normal bowel sounds, so passing gas and having normal bowel movements.  Suspicion for infection at the port site is low at this time no noted erythema, area is nontender to palpation.  I doubt infection of the VP shunt and/or the recent craniotomy there is no evidence infection my exam he has a negative lactic, no leukocytosis.  Doubt meningitis no meningeal sign, patient is not altered, would expect elevated white count or lactic if this was present.   Dispostion and problem list  After consideration of the diagnostic results and the patients response to treatment, I feel that the patent would benefit from admission.  SIRS-unclear origin, possible this is viral, currently on broad-spectrum antibiotics, including bank and cefepime which was susceptible for patient's CSF infection, patient will be transferred to [facility] ED to ED for neurosurgery evaluation and further recommendations.   .         Final Clinical Impression(s) / ED Diagnoses Final diagnoses:  SIRS (systemic inflammatory response syndrome) [facility])    Rx / DC Orders ED Discharge Orders     None         Carroll Sage, PA-C 01/30/23 0703    Nira Conn, MD 01/31/23 [phone]

## 2023-02-04 LAB — CULTURE, BLOOD (ROUTINE X 2)
Culture: NO GROWTH
Culture: NO GROWTH
Special Requests: ADEQUATE
Special Requests: ADEQUATE

## 2023-03-11 ENCOUNTER — Other Ambulatory Visit: Payer: Self-pay | Admitting: Surgery

## 2023-03-11 DIAGNOSIS — T148XXA Other injury of unspecified body region, initial encounter: Secondary | ICD-10-CM

## 2023-03-16 ENCOUNTER — Ambulatory Visit
Admission: RE | Admit: 2023-03-16 | Discharge: 2023-03-16 | Disposition: A | Discharge status: Home / Self Care | Payer: MEDICAID | Source: Ambulatory Visit | Attending: Surgery | Admitting: Surgery

## 2023-03-16 DIAGNOSIS — T148XXA Other injury of unspecified body region, initial encounter: Secondary | ICD-10-CM

## 2023-03-16 MED ORDER — IOPAMIDOL (ISOVUE-300) INJECTION 61%
100.0000 mL | Freq: Once | INTRAVENOUS | Status: AC | PRN
Start: 2023-03-16 — End: 2023-03-16
  Administered 2023-03-16: 65 mL via INTRAVENOUS
